# Patient Record
Sex: Female | Born: 1965 | Race: White | Hispanic: No | Smoking: Never smoker
Health system: Southern US, Community
[De-identification: ages and names within clinical notes are randomized; demographics above are authoritative.]

## PROBLEM LIST (undated history)

## (undated) DIAGNOSIS — R319 Hematuria, unspecified: Secondary | ICD-10-CM

## (undated) DIAGNOSIS — K219 Gastro-esophageal reflux disease without esophagitis: Secondary | ICD-10-CM

## (undated) DIAGNOSIS — M419 Scoliosis, unspecified: Secondary | ICD-10-CM

## (undated) DIAGNOSIS — T7840XA Allergy, unspecified, initial encounter: Secondary | ICD-10-CM

## (undated) DIAGNOSIS — M722 Plantar fascial fibromatosis: Secondary | ICD-10-CM

## (undated) HISTORY — DX: Hematuria, unspecified: R31.9

## (undated) HISTORY — PX: UPPER GI ENDOSCOPY: SHX6162

## (undated) HISTORY — DX: Scoliosis, unspecified: M41.9

## (undated) HISTORY — DX: Allergy, unspecified, initial encounter: T78.40XA

## (undated) HISTORY — PX: TONSILLECTOMY: SUR1361

## (undated) HISTORY — DX: Gastro-esophageal reflux disease without esophagitis: K21.9

## (undated) HISTORY — DX: Plantar fascial fibromatosis: M72.2

---

## 1998-10-28 ENCOUNTER — Other Ambulatory Visit: Admission: RE | Admit: 1998-10-28 | Discharge: 1998-10-28 | Payer: Self-pay | Admitting: Obstetrics and Gynecology

## 1999-11-06 ENCOUNTER — Other Ambulatory Visit: Admission: RE | Admit: 1999-11-06 | Discharge: 1999-11-06 | Payer: Self-pay | Admitting: Obstetrics and Gynecology

## 2000-11-18 ENCOUNTER — Other Ambulatory Visit: Admission: RE | Admit: 2000-11-18 | Discharge: 2000-11-18 | Payer: Self-pay | Admitting: Obstetrics and Gynecology

## 2001-12-25 ENCOUNTER — Other Ambulatory Visit: Admission: RE | Admit: 2001-12-25 | Discharge: 2001-12-25 | Payer: Self-pay | Admitting: Obstetrics and Gynecology

## 2003-01-28 ENCOUNTER — Other Ambulatory Visit: Admission: RE | Admit: 2003-01-28 | Discharge: 2003-01-28 | Payer: Self-pay | Admitting: Obstetrics and Gynecology

## 2003-06-02 ENCOUNTER — Ambulatory Visit (HOSPITAL_COMMUNITY): Admission: RE | Admit: 2003-06-02 | Discharge: 2003-06-02 | Payer: Self-pay | Admitting: Obstetrics and Gynecology

## 2004-03-11 ENCOUNTER — Ambulatory Visit: Payer: Self-pay | Admitting: Family Medicine

## 2004-03-13 ENCOUNTER — Other Ambulatory Visit: Admission: RE | Admit: 2004-03-13 | Discharge: 2004-03-13 | Payer: Self-pay | Admitting: Obstetrics and Gynecology

## 2004-12-04 ENCOUNTER — Ambulatory Visit: Payer: Self-pay | Admitting: Internal Medicine

## 2005-01-24 ENCOUNTER — Ambulatory Visit: Payer: Self-pay | Admitting: Family Medicine

## 2005-04-19 ENCOUNTER — Other Ambulatory Visit: Admission: RE | Admit: 2005-04-19 | Discharge: 2005-04-19 | Payer: Self-pay | Admitting: Obstetrics & Gynecology

## 2005-05-24 ENCOUNTER — Ambulatory Visit (HOSPITAL_COMMUNITY): Admission: RE | Admit: 2005-05-24 | Discharge: 2005-05-24 | Payer: Self-pay | Admitting: Obstetrics and Gynecology

## 2005-09-03 ENCOUNTER — Ambulatory Visit: Payer: Self-pay | Admitting: Internal Medicine

## 2005-12-25 ENCOUNTER — Ambulatory Visit: Payer: Self-pay | Admitting: Internal Medicine

## 2006-03-06 ENCOUNTER — Ambulatory Visit: Payer: Self-pay | Admitting: Internal Medicine

## 2006-05-06 ENCOUNTER — Ambulatory Visit: Payer: Self-pay | Admitting: Internal Medicine

## 2006-05-28 ENCOUNTER — Ambulatory Visit: Payer: Self-pay | Admitting: Internal Medicine

## 2006-06-18 ENCOUNTER — Other Ambulatory Visit: Admission: RE | Admit: 2006-06-18 | Discharge: 2006-06-18 | Payer: Self-pay | Admitting: Obstetrics & Gynecology

## 2006-07-17 ENCOUNTER — Ambulatory Visit (HOSPITAL_COMMUNITY): Admission: RE | Admit: 2006-07-17 | Discharge: 2006-07-17 | Payer: Self-pay | Admitting: Obstetrics and Gynecology

## 2006-10-14 DIAGNOSIS — K219 Gastro-esophageal reflux disease without esophagitis: Secondary | ICD-10-CM

## 2007-02-24 ENCOUNTER — Ambulatory Visit: Payer: Self-pay | Admitting: Internal Medicine

## 2007-02-24 DIAGNOSIS — H01009 Unspecified blepharitis unspecified eye, unspecified eyelid: Secondary | ICD-10-CM | POA: Insufficient documentation

## 2007-07-18 ENCOUNTER — Ambulatory Visit (HOSPITAL_COMMUNITY): Admission: RE | Admit: 2007-07-18 | Discharge: 2007-07-18 | Payer: Self-pay | Admitting: Obstetrics & Gynecology

## 2007-07-29 ENCOUNTER — Other Ambulatory Visit: Admission: RE | Admit: 2007-07-29 | Discharge: 2007-07-29 | Payer: Self-pay | Admitting: Obstetrics and Gynecology

## 2008-08-19 ENCOUNTER — Ambulatory Visit: Payer: Self-pay | Admitting: Sports Medicine

## 2008-08-19 DIAGNOSIS — M722 Plantar fascial fibromatosis: Secondary | ICD-10-CM

## 2008-08-19 DIAGNOSIS — M4120 Other idiopathic scoliosis, site unspecified: Secondary | ICD-10-CM | POA: Insufficient documentation

## 2008-08-19 DIAGNOSIS — M2170 Unequal limb length (acquired), unspecified site: Secondary | ICD-10-CM

## 2008-09-15 ENCOUNTER — Ambulatory Visit (HOSPITAL_COMMUNITY): Admission: RE | Admit: 2008-09-15 | Discharge: 2008-09-15 | Payer: Self-pay | Admitting: Obstetrics & Gynecology

## 2010-07-07 ENCOUNTER — Other Ambulatory Visit: Payer: Self-pay | Admitting: Nurse Practitioner

## 2010-07-07 ENCOUNTER — Other Ambulatory Visit: Payer: Self-pay | Admitting: Obstetrics and Gynecology

## 2010-07-07 DIAGNOSIS — Z1231 Encounter for screening mammogram for malignant neoplasm of breast: Secondary | ICD-10-CM

## 2010-07-14 ENCOUNTER — Ambulatory Visit (HOSPITAL_COMMUNITY): Payer: Self-pay

## 2010-07-24 ENCOUNTER — Ambulatory Visit (HOSPITAL_COMMUNITY): Payer: Self-pay

## 2010-07-26 ENCOUNTER — Ambulatory Visit (HOSPITAL_COMMUNITY)
Admission: RE | Admit: 2010-07-26 | Discharge: 2010-07-26 | Disposition: A | Discharge status: Home / Self Care | Payer: BC Managed Care – PPO | Source: Ambulatory Visit | Attending: Obstetrics and Gynecology | Admitting: Obstetrics and Gynecology

## 2010-07-26 DIAGNOSIS — Z1231 Encounter for screening mammogram for malignant neoplasm of breast: Secondary | ICD-10-CM | POA: Insufficient documentation

## 2010-12-05 ENCOUNTER — Encounter: Payer: Self-pay | Admitting: Family Medicine

## 2010-12-05 ENCOUNTER — Ambulatory Visit (INDEPENDENT_AMBULATORY_CARE_PROVIDER_SITE_OTHER): Payer: BC Managed Care – PPO | Admitting: Family Medicine

## 2010-12-05 VITALS — BP 120/88 | HR 95 | Temp 100.4°F | Wt 156.0 lb

## 2010-12-05 DIAGNOSIS — J40 Bronchitis, not specified as acute or chronic: Secondary | ICD-10-CM

## 2010-12-05 MED ORDER — AZITHROMYCIN 250 MG PO TABS: ORAL_TABLET | ORAL | Status: AC

## 2010-12-05 MED ORDER — METHYLPREDNISOLONE ACETATE 80 MG/ML IJ SUSP
120.0000 mg | Freq: Once | INTRAMUSCULAR | Status: AC
  Administered 2010-12-05: 120 mg via INTRAMUSCULAR

## 2010-12-05 MED ORDER — HYDROCODONE-HOMATROPINE 5-1.5 MG/5ML PO SYRP: 5.0000 mL | ORAL_SOLUTION | ORAL | Status: AC | PRN

## 2010-12-05 NOTE — Progress Notes (Signed)
  Subjective:    Patient ID: Elizabeth [name], female    DOB: 25-Feb-1965, 45 y.o.   MRN: [id number]  HPI Here for one week of stuffy head, PND, ST, chest tightness, and a dry cough. No fever.    Review of Systems  Constitutional: Negative.   HENT: Positive for congestion, postnasal drip and sinus pressure.   Eyes: Negative.   Respiratory: Positive for cough.        Objective:   Physical Exam  Constitutional: She appears well-developed and well-nourished.  HENT:  Right Ear: External ear normal.  Left Ear: External ear normal.  Nose: Nose normal.  Mouth/Throat: Oropharynx is clear and moist. No oropharyngeal exudate.  Eyes: Conjunctivae are normal. Pupils are equal, round, and reactive to light.  Neck: No thyromegaly present.  Pulmonary/Chest: Effort normal and breath sounds normal. No respiratory distress. She has no wheezes. She has no rales. She exhibits no tenderness.  Lymphadenopathy:    She has no cervical adenopathy.          Assessment & Plan:  Recheck prn

## 2010-12-05 NOTE — Progress Notes (Signed)
Addended by: Aniceto Boss A on: 12/05/2010 05:03 PM   Modules accepted: Orders

## 2011-01-29 ENCOUNTER — Encounter: Payer: Self-pay | Admitting: Internal Medicine

## 2011-01-29 ENCOUNTER — Ambulatory Visit (INDEPENDENT_AMBULATORY_CARE_PROVIDER_SITE_OTHER): Payer: BC Managed Care – PPO | Admitting: Internal Medicine

## 2011-01-29 VITALS — BP 114/80 | Temp 100.8°F | Wt 148.0 lb

## 2011-01-29 DIAGNOSIS — J069 Acute upper respiratory infection, unspecified: Secondary | ICD-10-CM

## 2011-01-29 MED ORDER — HYDROCODONE-HOMATROPINE 5-1.5 MG/5ML PO SYRP: 5.0000 mL | ORAL_SOLUTION | Freq: Four times a day (QID) | ORAL | Status: DC | PRN

## 2011-01-29 NOTE — Patient Instructions (Signed)
Get plenty of rest, Drink lots of  clear liquids, and use Tylenol or ibuprofen for fever and discomfort.    Afrin NS use twice daily

## 2011-01-29 NOTE — Progress Notes (Signed)
  Subjective:    Patient ID: Elizabeth [name], female    DOB: 18-Jan-1966, 45 y.o.   MRN: [id number]  HPI  45 year old patient who presents with a three-day history of cough and congestion. She does have a history of allergic rhinitis. She has had fever to 102. There is been minimal sputum production no wheezing chest pain or shortness of breath. She has been using Mucinex DM and saline irrigation. She is followed by an allergist.    Review of Systems  Constitutional: Positive for fever and fatigue. Negative for chills.  HENT: Positive for congestion and rhinorrhea. Negative for hearing loss, sore throat, dental problem, sinus pressure and tinnitus.   Eyes: Negative for pain, discharge and visual disturbance.  Respiratory: Positive for cough. Negative for shortness of breath.   Cardiovascular: Negative for chest pain, palpitations and leg swelling.  Gastrointestinal: Negative for nausea, vomiting, abdominal pain, diarrhea, constipation, blood in stool and abdominal distention.  Genitourinary: Negative for dysuria, urgency, frequency, hematuria, flank pain, vaginal bleeding, vaginal discharge, difficulty urinating, vaginal pain and pelvic pain.  Musculoskeletal: Negative for joint swelling, arthralgias and gait problem.  Skin: Negative for rash.  Neurological: Negative for dizziness, syncope, speech difficulty, weakness, numbness and headaches.  Hematological: Negative for adenopathy.  Psychiatric/Behavioral: Negative for behavioral problems, dysphoric mood and agitation. The patient is not nervous/anxious.        Objective:   Physical Exam  Constitutional: She is oriented to person, place, and time. She appears well-developed and well-nourished.  HENT:  Head: Normocephalic.  Right Ear: External ear normal.  Left Ear: External ear normal.  Mouth/Throat: Oropharynx is clear and moist.  Eyes: Conjunctivae and EOM are normal. Pupils are equal, round, and reactive to light.  Neck: Normal  range of motion. Neck supple. No thyromegaly present.  Cardiovascular: Normal rate, regular rhythm, normal heart sounds and intact distal pulses.   Pulmonary/Chest: Effort normal and breath sounds normal.  Abdominal: Soft. Bowel sounds are normal. She exhibits no mass. There is no tenderness.  Musculoskeletal: Normal range of motion.  Lymphadenopathy:    She has no cervical adenopathy.  Neurological: She is alert and oriented to person, place, and time.  Skin: Skin is warm and dry. No rash noted.  Psychiatric: She has a normal mood and affect. Her behavior is normal.          Assessment & Plan:   Viral URI. Will treat systematically. We'll force fluids use short-term Afrin nasal spray. Allergic rhinitis

## 2011-02-01 ENCOUNTER — Telehealth: Payer: Self-pay | Admitting: *Deleted

## 2011-02-01 MED ORDER — AZITHROMYCIN 250 MG PO TABS: ORAL_TABLET | ORAL | Status: AC

## 2011-02-01 NOTE — Telephone Encounter (Signed)
Pt was seen on Monday with fever and severe congestion.  Congestion is green and thicker, still has a fever and not feeling any better.  Needs advise on what to do.  [employer].

## 2011-02-01 NOTE — Telephone Encounter (Signed)
Pls advise.  

## 2011-02-01 NOTE — Telephone Encounter (Signed)
zpack

## 2011-02-01 NOTE — Telephone Encounter (Signed)
rx sent to pharmacy electronically.

## 2011-02-28 ENCOUNTER — Encounter: Payer: Self-pay | Admitting: Internal Medicine

## 2011-04-17 ENCOUNTER — Encounter: Payer: Self-pay | Admitting: *Deleted

## 2011-04-27 ENCOUNTER — Ambulatory Visit (INDEPENDENT_AMBULATORY_CARE_PROVIDER_SITE_OTHER): Payer: BC Managed Care – PPO | Admitting: Internal Medicine

## 2011-04-27 ENCOUNTER — Encounter: Payer: Self-pay | Admitting: Internal Medicine

## 2011-04-27 VITALS — BP 112/64 | HR 76 | Ht 64.0 in | Wt 150.0 lb

## 2011-04-27 DIAGNOSIS — K219 Gastro-esophageal reflux disease without esophagitis: Secondary | ICD-10-CM

## 2011-04-27 NOTE — Progress Notes (Signed)
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 She describes it as a gurgling sensation. She wakes up in the morning with a sore throat and cough. It started one day as she was working in her yard spreading mulch.She thought it was allergies.  Her allergist put her on Nexium 40 mg a day which she has taken now for 10 years but feels that it is not working anymore. She denies dysphagia, odynophagia or heartburn. She doesn't smoke and does not take any anti-inflammatory agents. She drinks 1-2 glasses of wine a day.   Past Medical History  Diagnosis Date  . GERD (gastroesophageal reflux disease)   . Allergy     sees Dr. Lucie Leather  . Scoliosis     mild thoracolumnar-per Dr Lesia Hausen note in [facility]  . Plantar fasciitis    Past Surgical History  Procedure Date  . Tonsillectomy     reports that she has never smoked. She has never used smokeless tobacco. She reports that she drinks about .5 - 1 ounces of alcohol per week. She reports that she does not use illicit drugs. family history includes Coronary artery disease in her other and Heart disease in her other.  There is no history of Colon cancer. Allergies  Allergen Reactions  . Pantoprazole Sodium     REACTION: unspecified        Review of Systems: Denies chest pain shortness of breath  The remainder of the 10 point ROS is negative except as outlined in H&P   Physical Exam: General appearance  Well developed, in no distress. Normal voice. Eyes- non icteric. HEENT nontraumatic, normocephalic. Mouth no lesions, tongue papillated, no cheilosis. Neck supple without adenopathy, thyroid not enlarged, no carotid bruits, no JVD. Lungs Clear to auscultation bilaterally. Cor normal S1, normal S2, regular rhythm, no murmur,  quiet precordium. Abdomen: Soft nontender abdomen with  normal active bowel sounds. No distention. Rectal: Not done. Extremities no pedal edema. Skin no lesions., no avm's,  Neurological alert and oriented x 3. Psychological normal mood and affect.  Assessment and Plan:  Problem #1 Long-standing sensation in the upper chest and upper esophagus suggestive of gastroesophageal reflux. It has been up to this point controlled by Nexium 40 mg a day but she is having some breakthrough symptoms. Other possible explanations would be globus sensation from spasm or possibly post nasal drip or allergies. She is concerned about taking her PPI for 10 years without having a diagnostic test and I agree with her that an upper endoscopy would be in order with biopsies of the distal esophagus to rule out Barrett's esophagus. I asked her at this point to stop taking her Nexium until we have a chance to endoscopically examine her esophagus.   04/27/2011 Lina Sar

## 2011-04-27 NOTE — Patient Instructions (Signed)
You have been scheduled for an endoscopy with propofol. Please follow written instructions given to you at your visit today. CC: Dr Eleonore Chiquito

## 2011-05-11 ENCOUNTER — Encounter: Payer: Self-pay | Admitting: Internal Medicine

## 2011-05-11 ENCOUNTER — Ambulatory Visit (AMBULATORY_SURGERY_CENTER): Payer: BC Managed Care – PPO | Admitting: Internal Medicine

## 2011-05-11 VITALS — BP 158/89 | HR 86 | Temp 96.7°F | Resp 20 | Ht 64.0 in | Wt 150.0 lb

## 2011-05-11 DIAGNOSIS — K219 Gastro-esophageal reflux disease without esophagitis: Secondary | ICD-10-CM

## 2011-05-11 MED ORDER — SODIUM CHLORIDE 0.9 % IV SOLN: 500.0000 mL | INTRAVENOUS | Status: DC

## 2011-05-11 MED ORDER — HYOSCYAMINE SULFATE 0.125 MG SL SUBL: 0.1250 mg | SUBLINGUAL_TABLET | SUBLINGUAL | Status: DC | PRN

## 2011-05-11 NOTE — Progress Notes (Signed)
Patient did not experience any of the following events: a burn prior to discharge; a fall within the facility; wrong site/side/patient/procedure/implant event; or a hospital transfer or hospital admission upon discharge from the facility. (G8907) Patient did not have preoperative order for IV antibiotic SSI prophylaxis. (G8918)  

## 2011-05-11 NOTE — Op Note (Signed)
[facility] [address] [place], [place]  [address]  ENDOSCOPY PROCEDURE REPORT  PATIENT:  Elizabeth [name], Elizabeth [name]  MR#:  [id number] BIRTHDATE:  1966/01/21, 46 yrs. old  GENDER:  female  ENDOSCOPIST:  Hedwig Morton. Juanda Chance, MD Referred by:  Eleonore Chiquito, M.D.  PROCEDURE DATE:  05/11/2011 PROCEDURE:  EGD, diagnostic 43235 ASA CLASS:  Class I INDICATIONS:  globus, GERD Nexiem x 10 years, stopped working  MEDICATIONS:   MAC sedation, administered by CRNA, propofol (Diprivan) 150 mg TOPICAL ANESTHETIC:  none  DESCRIPTION OF PROCEDURE:   After the risks benefits and alternatives of the procedure were thoroughly explained, informed consent was obtained.  The LB GIF-H180 [id number] endoscope was introduced through the mouth and advanced to the second portion of the duodenum, without limitations.  The instrument was slowly withdrawn as the mucosa was fully examined. <<PROCEDUREIMAGES>>  The upper, middle, and distal third of the esophagus were carefully inspected and no abnormalities were noted. The z-line was well seen at the GEJ. The endoscope was pushed into the fundus which was normal including a retroflexed view. The antrum,gastric body, first and second part of the duodenum were unremarkable (see image1, image2, image3, image4, image5, image6, image7, and image8).    Retroflexed views revealed no abnormalities.    The scope was then withdrawn from the patient and the procedure completed.  COMPLICATIONS:  None  ENDOSCOPIC IMPRESSION: 1) Normal EGD no stricture, hiatal hernia or esophagitis RECOMMENDATIONS: 1) Anti-reflux regimen to be follow stop nexiem, may use OTC preparations such as Ranitidine, Gaviscon consider Barium esophagram to assess motility if Sx's persist  REPEAT EXAM:  In 0 year(s) for.  ______________________________ Hedwig Morton. Juanda Chance, MD  CC:  n. eSIGNED:   Hedwig Morton. Lashanti Chambless at 05/11/2011 09:12 AM  Cecile Hearing, [id number]

## 2011-05-11 NOTE — Progress Notes (Signed)
Patient did not experience any of the following events: a burn prior to discharge; a fall within the facility; wrong site/side/patient/procedure/implant event; or a hospital transfer or hospital admission upon discharge from the facility. (G8907) Patient did not have preoperative order for IV antibiotic SSI prophylaxis. (G8918) Patient did not have preoperative order for IV antibiotic SSI prophylaxis. (G8918)  

## 2011-05-11 NOTE — Patient Instructions (Signed)

## 2011-05-14 ENCOUNTER — Telehealth: Payer: Self-pay | Admitting: *Deleted

## 2011-05-14 NOTE — Telephone Encounter (Signed)
  Follow up Call-  Call back number 05/11/2011  Post procedure Call Back phone  # [phone]  Permission to leave phone message Yes     Patient questions:  Left message to call if necessary.

## 2011-08-08 ENCOUNTER — Other Ambulatory Visit: Payer: Self-pay | Admitting: Certified Nurse Midwife

## 2011-08-08 DIAGNOSIS — Z1231 Encounter for screening mammogram for malignant neoplasm of breast: Secondary | ICD-10-CM

## 2011-08-13 ENCOUNTER — Encounter: Payer: Self-pay | Admitting: Internal Medicine

## 2011-08-13 ENCOUNTER — Ambulatory Visit (INDEPENDENT_AMBULATORY_CARE_PROVIDER_SITE_OTHER): Payer: BC Managed Care – PPO | Admitting: Internal Medicine

## 2011-08-13 VITALS — BP 120/70 | Temp 98.1°F | Wt 148.0 lb

## 2011-08-13 DIAGNOSIS — J309 Allergic rhinitis, unspecified: Secondary | ICD-10-CM | POA: Insufficient documentation

## 2011-08-13 DIAGNOSIS — J069 Acute upper respiratory infection, unspecified: Secondary | ICD-10-CM

## 2011-08-13 MED ORDER — HYDROCODONE-HOMATROPINE 5-1.5 MG/5ML PO SYRP: 5.0000 mL | ORAL_SOLUTION | Freq: Four times a day (QID) | ORAL | Status: AC | PRN

## 2011-08-13 NOTE — Patient Instructions (Addendum)
Get plenty of rest, Drink lots of  clear liquids, and use Tylenol or ibuprofen for fever and discomfort.    Norel AD  One every 6 hours as needed  Qnasl  Use daily  Cough med as needed

## 2011-08-13 NOTE — Progress Notes (Signed)
  Subjective:    Patient ID: [name], female    DOB: 1965-03-15, 46 y.o.   MRN: [id number]  HPI 46 year old patient who presents with a one-week history of nasal congestion chest congestion and nonproductive cough. She does have a history of allergic rhinitis. No fever wheezing or sputum production.  She has had a GI evaluation and chronic Nexium therapy has been discontinued. She has done well off medication without reflux symptoms.    Review of Systems  Constitutional: Negative.   HENT: Positive for congestion. Negative for hearing loss, sore throat, rhinorrhea, dental problem, sinus pressure and tinnitus.   Eyes: Negative for pain, discharge and visual disturbance.  Respiratory: Positive for cough. Negative for shortness of breath.   Cardiovascular: Negative for chest pain, palpitations and leg swelling.  Gastrointestinal: Negative for nausea, vomiting, abdominal pain, diarrhea, constipation, blood in stool and abdominal distention.  Genitourinary: Negative for dysuria, urgency, frequency, hematuria, flank pain, vaginal bleeding, vaginal discharge, difficulty urinating, vaginal pain and pelvic pain.  Musculoskeletal: Negative for joint swelling, arthralgias and gait problem.  Skin: Negative for rash.  Neurological: Negative for dizziness, syncope, speech difficulty, weakness, numbness and headaches.  Hematological: Negative for adenopathy.  Psychiatric/Behavioral: Negative for behavioral problems, dysphoric mood and agitation. The patient is not nervous/anxious.        Objective:   Physical Exam  Constitutional: She is oriented to person, place, and time. She appears well-developed and well-nourished.  HENT:  Head: Normocephalic.  Right Ear: External ear normal.  Left Ear: External ear normal.  Mouth/Throat: Oropharynx is clear and moist.  Eyes: Conjunctivae and EOM are normal. Pupils are equal, round, and reactive to light.  Neck: Normal range of motion. Neck supple. No  thyromegaly present.  Cardiovascular: Normal rate, regular rhythm, normal heart sounds and intact distal pulses.   Pulmonary/Chest: Effort normal and breath sounds normal.  Abdominal: Soft. Bowel sounds are normal. She exhibits no mass. There is no tenderness.  Musculoskeletal: Normal range of motion.  Lymphadenopathy:    She has no cervical adenopathy.  Neurological: She is alert and oriented to person, place, and time.  Skin: Skin is warm and dry. No rash noted.  Psychiatric: She has a normal mood and affect. Her behavior is normal.          Assessment & Plan:   Viral URI with cough.  Treat symptomatically

## 2011-08-16 ENCOUNTER — Telehealth: Payer: Self-pay | Admitting: Internal Medicine

## 2011-08-16 MED ORDER — VALACYCLOVIR HCL 500 MG PO TABS: 500.0000 mg | ORAL_TABLET | ORAL | Status: DC | PRN

## 2011-08-16 MED ORDER — AZITHROMYCIN 250 MG PO TABS: ORAL_TABLET | ORAL | Status: AC

## 2011-08-16 NOTE — Telephone Encounter (Signed)
Caller: Lu/Patient; PCP: Eleonore Chiquito; CB#: [phone]; ; ; Call regarding Sinus Infection; Onset- 08/07/11  Afebrile.  Pt has cough, congestion, sinus  pressure. States she was seen this week but antibiotic was not given and she is calling to request antibiotic. She also states she mentioned to nurse at visit she needed a refill of Valtrex. Emergent s/s of URI s/s protocol r/o. See provider within 24hrs. Pt already evaluated, requesting antibiotic. Pharmacy is [facility]. Pt also would like to discuss changing care to Dr. [name].

## 2011-08-16 NOTE — Telephone Encounter (Signed)
Z Pack Valtrex RF OK Transfer Dr [name] OK

## 2011-08-16 NOTE — Telephone Encounter (Signed)
I have handled med rx's -  FYI to you r/t transfer of care request

## 2011-08-16 NOTE — Telephone Encounter (Signed)
Please advise 

## 2011-08-20 ENCOUNTER — Other Ambulatory Visit: Payer: Self-pay | Admitting: Family Medicine

## 2011-08-27 ENCOUNTER — Telehealth: Payer: Self-pay | Admitting: Family Medicine

## 2011-08-27 NOTE — Telephone Encounter (Signed)
Caller: Elizabeth [name]/Patient; PCP: Eleonore Chiquito; CB#: [phone]; ; ; Call regarding Cough/Congestion;  Onset- weeks now per pt. Afebrile. States she finished her Zpak on the 16th and still has s/s. She has chest congestion and a  cough. Emergent s/s of Cough protocol r/o. Pt to see provider within 24 hrs. Appt scheduled for tomorrow 08/28/11 at 10:45am with Dr. [name].

## 2011-08-28 ENCOUNTER — Encounter: Payer: Self-pay | Admitting: Family Medicine

## 2011-08-28 ENCOUNTER — Ambulatory Visit (INDEPENDENT_AMBULATORY_CARE_PROVIDER_SITE_OTHER): Payer: BC Managed Care – PPO | Admitting: Family Medicine

## 2011-08-28 VITALS — BP 120/78 | HR 87 | Temp 98.9°F | Wt 149.0 lb

## 2011-08-28 DIAGNOSIS — J40 Bronchitis, not specified as acute or chronic: Secondary | ICD-10-CM

## 2011-08-28 MED ORDER — AMOXICILLIN-POT CLAVULANATE 875-125 MG PO TABS: 1.0000 | ORAL_TABLET | Freq: Two times a day (BID) | ORAL | Status: DC

## 2011-08-28 MED ORDER — METHYLPREDNISOLONE ACETATE 80 MG/ML IJ SUSP
120.0000 mg | Freq: Once | INTRAMUSCULAR | Status: AC
Start: 2011-08-28 — End: 2011-08-28
  Administered 2011-08-28: 120 mg via INTRAMUSCULAR

## 2011-08-28 MED ORDER — HYDROCODONE-HOMATROPINE 5-1.5 MG/5ML PO SYRP: 5.0000 mL | ORAL_SOLUTION | ORAL | Status: DC | PRN

## 2011-08-28 NOTE — Telephone Encounter (Signed)
ok 

## 2011-08-28 NOTE — Telephone Encounter (Signed)
Okay with me to switch 

## 2011-08-28 NOTE — Addendum Note (Signed)
Addended by: Aniceto Boss A on: 08/28/2011 12:08 PM   Modules accepted: Orders

## 2011-08-28 NOTE — Progress Notes (Signed)
  Subjective:    Patient ID: Elizabeth [name], female    DOB: [birth year], 46 y.o.   MRN: [id number]  HPI Here for continued URI symptoms. She was here a few weeks ago and was given a Zpack. This did not help much. She is still coughing with a lot of chest congestion. The cough is dry. No fever.    Review of Systems  Constitutional: Negative.   HENT: Positive for congestion and postnasal drip.   Eyes: Negative.   Respiratory: Positive for cough and chest tightness. Negative for wheezing.        Objective:   Physical Exam  Constitutional: She appears well-developed and well-nourished.  HENT:  Right Ear: External ear normal.  Left Ear: External ear normal.  Nose: Nose normal.  Eyes: Conjunctivae are normal.  Neck: No thyromegaly present.  Pulmonary/Chest: Effort normal and breath sounds normal. No respiratory distress. She has no wheezes. She has no rales.       Scattered rhonchi  Lymphadenopathy:    She has no cervical adenopathy.          Assessment & Plan:  Given a steroid shot along with Augmentin. Recheck prn

## 2011-08-28 NOTE — Telephone Encounter (Signed)
Called pt to advise her that Dr. [name] is currently not accepting any new patients.  Pt states she was just seen by Dr. [name] today and was under the impression that he was already her pcp.  I reiterated to the patient that she is still a pt of Dr. Vernon Prey and we must get approval to switch pcps.  Please advise.

## 2011-08-29 ENCOUNTER — Ambulatory Visit (HOSPITAL_COMMUNITY)
Admission: RE | Admit: 2011-08-29 | Discharge: 2011-08-29 | Disposition: A | Discharge status: Home / Self Care | Payer: BC Managed Care – PPO | Source: Ambulatory Visit | Attending: Certified Nurse Midwife | Admitting: Certified Nurse Midwife

## 2011-08-29 DIAGNOSIS — Z1231 Encounter for screening mammogram for malignant neoplasm of breast: Secondary | ICD-10-CM

## 2012-05-07 ENCOUNTER — Ambulatory Visit (INDEPENDENT_AMBULATORY_CARE_PROVIDER_SITE_OTHER): Payer: BC Managed Care – PPO | Admitting: Family Medicine

## 2012-05-07 ENCOUNTER — Encounter: Payer: Self-pay | Admitting: Family Medicine

## 2012-05-07 VITALS — BP 118/76 | HR 81 | Temp 100.6°F | Wt 150.0 lb

## 2012-05-07 DIAGNOSIS — J019 Acute sinusitis, unspecified: Secondary | ICD-10-CM

## 2012-05-07 MED ORDER — AMOXICILLIN-POT CLAVULANATE 875-125 MG PO TABS: 1.0000 | ORAL_TABLET | Freq: Two times a day (BID) | ORAL | Status: AC

## 2012-05-07 MED ORDER — HYDROCODONE-HOMATROPINE 5-1.5 MG/5ML PO SYRP: 5.0000 mL | ORAL_SOLUTION | ORAL | Status: AC | PRN

## 2012-05-07 NOTE — Progress Notes (Signed)
  Subjective:    Patient ID: Elizabeth [name], female    DOB: [birth year], 47 y.o.   MRN: [id number]  HPI Here for 3 days of HA, PND, fever and a dry cough. Using Mucinex and Claritin.    Review of Systems  Constitutional: Positive for fever.  HENT: Positive for congestion, postnasal drip and sinus pressure.   Eyes: Negative.   Respiratory: Positive for cough.        Objective:   Physical Exam  Constitutional: She appears well-developed and well-nourished.  HENT:  Right Ear: External ear normal.  Left Ear: External ear normal.  Nose: Nose normal.  Mouth/Throat: Oropharynx is clear and moist.  Eyes: Conjunctivae are normal.  Pulmonary/Chest: Effort normal and breath sounds normal.  Lymphadenopathy:    She has no cervical adenopathy.          Assessment & Plan:  Drink fluids.

## 2012-05-16 ENCOUNTER — Telehealth: Payer: Self-pay | Admitting: Family Medicine

## 2012-05-16 MED ORDER — LEVOFLOXACIN 500 MG PO TABS: 500.0000 mg | ORAL_TABLET | Freq: Every day | ORAL | Status: DC

## 2012-05-16 MED ORDER — METHYLPREDNISOLONE 4 MG PO KIT: PACK | ORAL | Status: DC

## 2012-05-16 NOTE — Telephone Encounter (Signed)
I sent both scripts e-scribe and left voice message for pt.

## 2012-05-16 NOTE — Telephone Encounter (Signed)
Patient Information:  Caller Name: Kaelan  Phone: [phone]  Patient: Elizabeth [name], Elizabeth [name]  Gender: Female  DOB: 1965/10/08  Age: 47 Years  PCP: Gershon Crane [facility])  Pregnant: No  Office Follow Up:  Does the office need to follow up with this patient?: Yes  Instructions For The Office: Please follow up with patient regarding appointment today as none appear available for Dr. [name].  She requests late afternoon if possible.  Thank you.   Symptoms  Reason For Call & Symptoms: "I'm not better."  Seen  05/07/12, on last day of antibiotics.  Cough  and sinus congestion persist. States she normally requires antibiotics and steroids to get well; she did not get steroids for current symptoms.  Sinus pain /pressure reported at 6 of 10.  Reviewed Health History In EMR: Yes  Reviewed Medications In EMR: Yes  Reviewed Allergies In EMR: Yes  Reviewed Surgeries / Procedures: Yes  Date of Onset of Symptoms: 05/04/2012  Treatments Tried: Augmentin, APAP, Guiafenesin  Treatments Tried Worked: No OB / GYN:  LMP: Unknown  Guideline(s) Used:  Sinus Pain and Congestion  Disposition Per Guideline:   See Today or Tomorrow in Office  Reason For Disposition Reached:   Sinus congestion (pressure, fullness) present > 10 days  Advice Given:  For a Stuffy Nose - Use Nasal Washes:  Introduction: Saline (salt water) nasal irrigation (nasal wash) is an effective and simple home remedy for treating stuffy nose and sinus congestion. The nose can be irrigated by pouring, spraying, or squirting salt water into the nose and then letting it run back out.  How it Helps: The salt water rinses out excess mucus, washes out any irritants (dust, allergens) that might be present, and moistens the nasal cavity.  Call Back If:   Sinus congestion (fullness) lasts longer than 10 days  You become worse.  Patient Will Follow Care Advice:  YES

## 2012-05-16 NOTE — Telephone Encounter (Signed)
No need for an OV today. Call in levaquin 500 mg q day for 10 days, also a medrol dose pack

## 2012-05-16 NOTE — Telephone Encounter (Signed)
Elizabeth [name], please ask Dr [name] to review for additional rx, OV, etc. Wants late afternoon if pssible - Dr [name] not here. See someone else this afternoon or add to the a.m.?

## 2012-07-14 ENCOUNTER — Other Ambulatory Visit: Payer: Self-pay | Admitting: Family Medicine

## 2012-08-04 ENCOUNTER — Other Ambulatory Visit: Payer: Self-pay | Admitting: Certified Nurse Midwife

## 2012-08-04 DIAGNOSIS — Z1231 Encounter for screening mammogram for malignant neoplasm of breast: Secondary | ICD-10-CM

## 2012-09-01 ENCOUNTER — Ambulatory Visit (HOSPITAL_COMMUNITY)
Admission: RE | Admit: 2012-09-01 | Discharge: 2012-09-01 | Disposition: A | Discharge status: Home / Self Care | Payer: BC Managed Care – PPO | Source: Ambulatory Visit | Attending: Certified Nurse Midwife | Admitting: Certified Nurse Midwife

## 2012-09-01 DIAGNOSIS — Z1231 Encounter for screening mammogram for malignant neoplasm of breast: Secondary | ICD-10-CM

## 2012-09-04 ENCOUNTER — Encounter: Payer: Self-pay | Admitting: Certified Nurse Midwife

## 2012-09-05 ENCOUNTER — Encounter: Payer: Self-pay | Admitting: Certified Nurse Midwife

## 2012-09-05 ENCOUNTER — Ambulatory Visit (INDEPENDENT_AMBULATORY_CARE_PROVIDER_SITE_OTHER): Payer: BC Managed Care – PPO | Admitting: Certified Nurse Midwife

## 2012-09-05 VITALS — BP 102/62 | HR 64 | Resp 16 | Ht 64.75 in | Wt 153.0 lb

## 2012-09-05 DIAGNOSIS — B009 Herpesviral infection, unspecified: Secondary | ICD-10-CM

## 2012-09-05 DIAGNOSIS — Z309 Encounter for contraceptive management, unspecified: Secondary | ICD-10-CM

## 2012-09-05 DIAGNOSIS — Z01419 Encounter for gynecological examination (general) (routine) without abnormal findings: Secondary | ICD-10-CM

## 2012-09-05 DIAGNOSIS — IMO0001 Reserved for inherently not codable concepts without codable children: Secondary | ICD-10-CM

## 2012-09-05 MED ORDER — VALACYCLOVIR HCL 500 MG PO TABS: 500.0000 mg | ORAL_TABLET | ORAL | Status: DC | PRN

## 2012-09-05 MED ORDER — NORETHIN ACE-ETH ESTRAD-FE 1-20 MG-MCG PO TABS: 1.0000 | ORAL_TABLET | Freq: Every day | ORAL | Status: DC

## 2012-09-05 NOTE — Progress Notes (Signed)
47 y.o. G0P0000 Single Caucasian Fe here for annual exam.  Periods scant to none. Contraception working well, no issues. See PCP prn. Not sexually active at present, no STD concerns or testing desired. No herpes outbreak in past year. No health issues today. Recent trip to [place]!  Patient's last menstrual period was 07/15/2011.          Sexually active: no  The current method of family planning is OCP (estrogen/progesterone).    Exercising: yes  walking & jogging Smoker:  no  Health Maintenance: Pap:  09-05-11 neg HPV HR neg MMG:  7/14 neg. Colonoscopy:  none BMD:   none TDaP:  2006 Labs: none Self breast exam: done occ   reports that she has never smoked. She has never used smokeless tobacco. She reports that she drinks about 3.5 ounces of alcohol per week. She reports that she does not use illicit drugs.  Past Medical History  Diagnosis Date  . GERD (gastroesophageal reflux disease)   . Allergy     sees Dr. Lucie Leather  . Scoliosis     mild thoracolumnar-per Dr Lesia Hausen note in [facility]  . Plantar fasciitis   . Hematuria     asymptomatic    Past Surgical History  Procedure Laterality Date  . Tonsillectomy      Current Outpatient Prescriptions  Medication Sig Dispense Refill  . GILDESS FE 1/20 1-20 MG-MCG tablet As directed      . mometasone (NASONEX) 50 MCG/ACT nasal spray       . valACYclovir (VALTREX) 500 MG tablet Take 1 tablet (500 mg total) by mouth as needed.  30 tablet  0  . [DISCONTINUED] zolpidem (AMBIEN) 10 MG tablet Take 10 mg by mouth at bedtime as needed.         No current facility-administered medications for this visit.    Family History  Problem Relation Age of Onset  . Coronary artery disease Other   . Heart disease Other   . Colon cancer Neg Hx   . Heart disease Father   . Hypertension Brother     ROS:  Pertinent items are noted in HPI.  Otherwise, a comprehensive ROS was negative.  Exam:   BP 102/62  Pulse 64  Resp 16  Ht  5' 4.75" (1.645 m)  Wt 153 lb (69.4 kg)  BMI 25.65 kg/m2  LMP 07/15/2011 Height: 5' 4.75" (164.5 cm)  Ht Readings from Last 3 Encounters:  09/05/12 5' 4.75" (1.645 m)  05/11/11 5\' 4"  (1.626 m)  04/27/11 5\' 4"  (1.626 m)    General appearance: alert, cooperative and appears stated age Head: Normocephalic, without obvious abnormality, atraumatic Neck: no adenopathy, supple, symmetrical, trachea midline and thyroid normal to inspection and palpation Lungs: clear to auscultation bilaterally Breasts: normal appearance, no masses or tenderness, No nipple retraction or dimpling, No nipple discharge or bleeding, No axillary or supraclavicular adenopathy Heart: regular rate and rhythm Abdomen: soft, non-tender; no masses,  no organomegaly Extremities: extremities normal, atraumatic, no cyanosis or edema Skin: Skin color, texture, turgor normal. No rashes or lesions Lymph nodes: Cervical, supraclavicular, and axillary nodes normal. No abnormal inguinal nodes palpated Neurologic: Grossly normal   Pelvic: External genitalia:  no lesions              Urethra:  normal appearing urethra with no masses, tenderness or lesions              Bartholin's and Skene's: normal  Vagina: normal appearing vagina with normal color and discharge, no lesions              Cervix: normal, non tender              Pap taken: no Bimanual Exam:  Uterus:  normal size, contour, position, consistency, mobility, non-tender and anteverted              Adnexa: normal adnexa and no mass, fullness, tenderness               Rectovaginal: Confirms               Anus:  normal sphincter tone, no lesions  A:  Well Woman with normal exam  Contraception OCP  History of herpes no outbreak in past year, needs Rx refill  P:   Reviewed health and wellness pertinent to exam  Rx Gildess 1/20 Fe  Rx Valtrex see order  Pap smear as per guidelines   Mammogram yearly pap smear not taken today  counseled on breast  self exam, mammography screening, adequate intake of calcium and vitamin D, diet and exercise, discussed colonoscopy recommended at 48 for screening, patient plans to schedule next summer.  return annually or prn  An After Visit Summary was printed and given to the patient.

## 2012-09-05 NOTE — Patient Instructions (Signed)

## 2012-09-08 NOTE — Progress Notes (Signed)
Note reviewed, agree with plan.  Reggie Welge, MD  

## 2012-10-13 ENCOUNTER — Telehealth: Payer: Self-pay | Admitting: Family Medicine

## 2012-10-13 NOTE — Telephone Encounter (Signed)
Patient is on Nexium DR 40mg  BID. This was rx'd by a Dr. Lucie Leather. It needs prior auth and that office will not do it, because patient needs an OV. Pharmacy is requesting we do it, but I cannot if we did not rx. Please advise regarding Dr. [name] prescribing.

## 2012-10-13 NOTE — Telephone Encounter (Signed)
She saw Dr. Juanda Chance (GI) for this last year and even had upper endoscopy. She needs to ask Dr. Juanda Chance for this prior authorization

## 2012-10-14 NOTE — Telephone Encounter (Signed)
Elizabeth [name], the request came from the pharmacy, not patient. I called pharmacy and they will send it Dr. Juanda Chance. Thank you.

## 2012-10-14 NOTE — Telephone Encounter (Signed)
Can you call pt?

## 2012-12-10 ENCOUNTER — Other Ambulatory Visit: Payer: Self-pay | Admitting: Family Medicine

## 2013-02-02 ENCOUNTER — Other Ambulatory Visit: Payer: Self-pay | Admitting: Family Medicine

## 2013-08-28 ENCOUNTER — Other Ambulatory Visit: Payer: Self-pay | Admitting: Certified Nurse Midwife

## 2013-08-28 DIAGNOSIS — Z1231 Encounter for screening mammogram for malignant neoplasm of breast: Secondary | ICD-10-CM

## 2013-09-08 ENCOUNTER — Ambulatory Visit (HOSPITAL_COMMUNITY)
Admission: RE | Admit: 2013-09-08 | Discharge: 2013-09-08 | Disposition: A | Discharge status: Home / Self Care | Payer: BC Managed Care – PPO | Source: Ambulatory Visit | Attending: Certified Nurse Midwife | Admitting: Certified Nurse Midwife

## 2013-09-08 DIAGNOSIS — Z1231 Encounter for screening mammogram for malignant neoplasm of breast: Secondary | ICD-10-CM | POA: Insufficient documentation

## 2013-09-09 ENCOUNTER — Ambulatory Visit (INDEPENDENT_AMBULATORY_CARE_PROVIDER_SITE_OTHER): Payer: BC Managed Care – PPO | Admitting: Certified Nurse Midwife

## 2013-09-09 ENCOUNTER — Encounter: Payer: Self-pay | Admitting: Certified Nurse Midwife

## 2013-09-09 VITALS — BP 118/82 | HR 72 | Ht 65.0 in | Wt 148.0 lb

## 2013-09-09 DIAGNOSIS — Z1211 Encounter for screening for malignant neoplasm of colon: Secondary | ICD-10-CM

## 2013-09-09 DIAGNOSIS — Z124 Encounter for screening for malignant neoplasm of cervix: Secondary | ICD-10-CM

## 2013-09-09 DIAGNOSIS — R319 Hematuria, unspecified: Secondary | ICD-10-CM

## 2013-09-09 DIAGNOSIS — Z01419 Encounter for gynecological examination (general) (routine) without abnormal findings: Secondary | ICD-10-CM

## 2013-09-09 DIAGNOSIS — Z0000 Encounter for general adult medical examination without abnormal findings: Secondary | ICD-10-CM

## 2013-09-09 LAB — POCT URINALYSIS DIPSTICK
BILIRUBIN UA: NEGATIVE
Glucose, UA: NEGATIVE
Ketones, UA: NEGATIVE
LEUKOCYTES UA: NEGATIVE
NITRITE UA: NEGATIVE
PH UA: 5
PROTEIN UA: NEGATIVE
UROBILINOGEN UA: NEGATIVE

## 2013-09-09 LAB — HEMOGLOBIN, FINGERSTICK: HEMOGLOBIN, FINGERSTICK: 12.1 g/dL (ref 12.0–16.0)

## 2013-09-09 NOTE — Patient Instructions (Signed)

## 2013-09-09 NOTE — Progress Notes (Signed)
Patient ID: Elizabeth [name], female   DOB: 28-Nov-1965, 48 y.o.   MRN: [id number] 48 y.o. G0P0000 Divorced Caucasian Fe here for annual exam. Periods scant to none with OCP use. Desires continuance for cycle control, history of menorrhagia and dysmenorrhea. Not sexually active. Sees PCP prn. Denies vaginal or urinary symptoms today. No new personal products. No health issues today.  Sexually active: no  The current method of family planning is OCP (estrogen/progesterone).  Exercising: yes walking, biking & jogging  Smoker: no   Health Maintenance:  Pap: 09-05-11 neg HPV HR neg  MMG: 09/08/13, Bi-Rads 1: negative Class B density  Colonoscopy: none  BMD: none  TDaP: 2006  Labs: HB:  12.1 Urine:  Mod RBC, others negative Self breast exam: done occ    reports that she has never smoked. She has never used smokeless tobacco. She reports that she drinks about 3.5 ounces of alcohol per week. She reports that she does not use illicit drugs.  Past Medical History  Diagnosis Date  . GERD (gastroesophageal reflux disease)   . Allergy     sees Dr. Lucie Leather  . Scoliosis     mild thoracolumnar-per Dr Lesia Hausen note in [facility]  . Plantar fasciitis   . Hematuria     asymptomatic    Past Surgical History  Procedure Laterality Date  . Tonsillectomy      Current Outpatient Prescriptions  Medication Sig Dispense Refill  . NASONEX 50 MCG/ACT nasal spray USE 2 SPRAYS IN EACH NOSTRIL ONCE DAILY  17 g  3  . norethindrone-ethinyl estradiol (GILDESS FE 1/20) 1-20 MG-MCG tablet Take 1 tablet by mouth daily. As directed  3 Package  4  . valACYclovir (VALTREX) 500 MG tablet Take 1 tablet (500 mg total) by mouth as needed.  30 tablet  12  . [DISCONTINUED] zolpidem (AMBIEN) 10 MG tablet Take 10 mg by mouth at bedtime as needed.         No current facility-administered medications for this visit.    Family History  Problem Relation Age of Onset  . Colon cancer Neg Hx   . Heart disease Father   .  Hypertension Brother   . Other Mother     AFIB    ROS:  Pertinent items are noted in HPI.  Otherwise, a comprehensive ROS was negative.  Exam:   BP 118/82  Pulse 72  Ht 5\' 5"  (1.651 m)  Wt 148 lb (67.132 kg)  BMI 24.63 kg/m2 Height: 5\' 5"  (165.1 cm)  Ht Readings from Last 3 Encounters:  09/09/13 5\' 5"  (1.651 m)  09/05/12 5' 4.75" (1.645 m)  05/11/11 5\' 4"  (1.626 m)    General appearance: alert, cooperative and appears stated age Head: Normocephalic, without obvious abnormality, atraumatic Neck: no adenopathy, supple, symmetrical, trachea midline and thyroid normal to inspection and palpation and non-palpable Lungs: clear to auscultation bilaterally Breasts: normal appearance, no masses or tenderness, No nipple retraction or dimpling, No nipple discharge or bleeding, No axillary or supraclavicular adenopathy Heart: regular rate and rhythm Abdomen: soft, non-tender; no masses,  no organomegaly Extremities: extremities normal, atraumatic, no cyanosis or edema Skin: Skin color, texture, turgor normal. No rashes or lesions Lymph nodes: Cervical, supraclavicular, and axillary nodes normal. No abnormal inguinal nodes palpated Neurologic: Grossly normal   Pelvic: External genitalia:  no lesions              Urethra:  normal appearing urethra with no masses, tenderness or lesions  Bartholin's and Skene's: normal                 Vagina: normal appearing vagina with normal color and discharge, no lesions              Cervix: normal, non tender              Pap taken: Yes.   Bimanual Exam:  Uterus:  normal size, contour, position, consistency, mobility, non-tender and anteverted              Adnexa: normal adnexa and no mass, fullness, tenderness               Rectovaginal: Confirms               Anus:  normal sphincter tone, no lesions  A:  Well Woman with normal exam  Contraception desires OCP for cycle control  Hematuria R/O UTI    P:   Reviewed health and wellness  pertinent to exam  Rx Loestrin 1/20 Fe see order  Urine micro/culture  Pap smear taken today with HPV reflex   counseled on breast self exam, mammography screening, use and side effects of OCP's, adequate intake of calcium and vitamin D, diet and exercise risks and benefits of colonoscopy, declines referral today. IFOB dispensed.  return annually or prn  An After Visit Summary was printed and given to the patient.

## 2013-09-10 LAB — URINALYSIS, MICROSCOPIC ONLY
BACTERIA UA: NONE SEEN
CASTS: NONE SEEN
Crystals: NONE SEEN
Squamous Epithelial / LPF: NONE SEEN

## 2013-09-10 LAB — URINE CULTURE
Colony Count: NO GROWTH
Organism ID, Bacteria: NO GROWTH

## 2013-09-10 NOTE — Progress Notes (Signed)
Reviewed personally.  M. Suzanne Starletta Houchin, MD.  

## 2013-09-11 LAB — IPS PAP TEST WITH REFLEX TO HPV

## 2013-11-10 ENCOUNTER — Encounter: Payer: Self-pay | Admitting: Internal Medicine

## 2013-11-21 ENCOUNTER — Other Ambulatory Visit: Payer: Self-pay | Admitting: Certified Nurse Midwife

## 2013-11-23 NOTE — Telephone Encounter (Signed)
Last refilled: 09/05/12 #3 packs/4 rfs Last AEX: 09/09/13 with Ms. Debbie AEX Scheduled: 11/25/14 with Dr. Hyacinth MeekerMiller  According to AEX note from 09/09/13 Loestrin was sent and to see order.  No rx was sent electronically to pharmacy, patient takes rx continuously.  Gildess FE 1/20 #84/4 rfs [facility]  Routed to provider for review, encounter closed.

## 2013-12-25 ENCOUNTER — Ambulatory Visit (AMBULATORY_SURGERY_CENTER): Payer: Self-pay | Admitting: *Deleted

## 2013-12-25 VITALS — Ht 65.0 in | Wt 156.0 lb

## 2013-12-25 DIAGNOSIS — Z1211 Encounter for screening for malignant neoplasm of colon: Secondary | ICD-10-CM

## 2013-12-25 NOTE — Progress Notes (Signed)
No egg or soy allergy. No anesthesia problems.  No home O2.  No diet meds.  

## 2014-01-04 ENCOUNTER — Encounter: Payer: Self-pay | Admitting: Internal Medicine

## 2014-01-13 ENCOUNTER — Telehealth: Payer: Self-pay | Admitting: Internal Medicine

## 2014-01-13 NOTE — Telephone Encounter (Signed)
Patient calling to report she has had a cold since Saturday. She is scheduled Friday for direct colonoscopy. She states her temperature was 99.1 last night. Her drainage is clear but she reports she does get sinus infections easily. She wants to be sure she should proceed with procedure on Friday.

## 2014-01-13 NOTE — Telephone Encounter (Signed)
She can have colonoscopy if Temp < 100.0

## 2014-01-13 NOTE — Telephone Encounter (Signed)
Patient notified of temperature cutoff.

## 2014-01-15 ENCOUNTER — Ambulatory Visit (AMBULATORY_SURGERY_CENTER): Payer: BC Managed Care – PPO | Admitting: Internal Medicine

## 2014-01-15 ENCOUNTER — Encounter: Payer: Self-pay | Admitting: Internal Medicine

## 2014-01-15 VITALS — BP 126/86 | HR 64 | Temp 97.2°F | Resp 13 | Ht 65.0 in | Wt 156.0 lb

## 2014-01-15 DIAGNOSIS — Z1211 Encounter for screening for malignant neoplasm of colon: Secondary | ICD-10-CM

## 2014-01-15 MED ORDER — SODIUM CHLORIDE 0.9 % IV SOLN: 500.0000 mL | INTRAVENOUS | Status: DC

## 2014-01-15 NOTE — Op Note (Signed)
[facility] [address] [place], [id number]   COLONOSCOPY PROCEDURE REPORT  PATIENT: Elizabeth LinseyLysiak, [name]  MR#: [id number] BIRTHDATE: 07/03/1965 , 48  yrs. old GENDER: female ENDOSCOPIST: Hart Carwinora M Kimm Ungaro, MD REFERRED XB:[id number]:Stephen Marguerita BeardsA Fry, M.D. PROCEDURE DATE:  01/15/2014 PROCEDURE:   Colonoscopy, screening First Screening Colonoscopy - Avg.  risk and is 50 yrs.  old or older Yes.  Prior Negative Screening - Now for repeat screening. N/A  History of Adenoma - Now for follow-up colonoscopy & has been > or = to 3 yrs.  N/A  Polyps Removed Today? No. ASA CLASS:   Class I INDICATIONS:average risk for colon cancer. MEDICATIONS: Monitored anesthesia care and Propofol 400 mg IV  DESCRIPTION OF PROCEDURE:   After the risks benefits and alternatives of the procedure were thoroughly explained, informed consent was obtained.  The digital rectal exam revealed no abnormalities of the rectum.   The LB PFC-H190 [id number]  endoscope was introduced through the anus and advanced to the cecum, which was identified by both the appendix and ileocecal valve. No adverse events experienced.   The quality of the prep was good, using MoviPrep  The instrument was then slowly withdrawn as the colon was fully examined.      COLON FINDINGS: There was mild diverticulosis noted in the sigmoid colon.  Retroflexed views revealed no abnormalities. The time to cecum=3 minutes 35 seconds.  Withdrawal time=7 minutes 19 seconds. The scope was withdrawn and the procedure completed. COMPLICATIONS: There were no immediate complications.  ENDOSCOPIC IMPRESSION: There was mild diverticulosis noted in the sigmoid colon  RECOMMENDATIONS: High fiber diet Recall colonoscopy in 10 years  eSigned:  Hart Carwinora M Zi Newbury, MD 01/15/2014 10:49 AM   cc:

## 2014-01-15 NOTE — Patient Instructions (Signed)

## 2014-01-15 NOTE — Progress Notes (Signed)
Pt stable to RR 

## 2014-01-18 ENCOUNTER — Ambulatory Visit (INDEPENDENT_AMBULATORY_CARE_PROVIDER_SITE_OTHER): Payer: BC Managed Care – PPO | Admitting: Family Medicine

## 2014-01-18 ENCOUNTER — Encounter: Payer: Self-pay | Admitting: Family Medicine

## 2014-01-18 ENCOUNTER — Telehealth: Payer: Self-pay

## 2014-01-18 VITALS — BP 131/87 | HR 84 | Temp 99.0°F | Ht 65.0 in | Wt 157.0 lb

## 2014-01-18 DIAGNOSIS — J0100 Acute maxillary sinusitis, unspecified: Secondary | ICD-10-CM

## 2014-01-18 MED ORDER — HYDROCODONE-HOMATROPINE 5-1.5 MG/5ML PO SYRP: 5.0000 mL | ORAL_SOLUTION | ORAL | Status: DC | PRN

## 2014-01-18 MED ORDER — AMOXICILLIN-POT CLAVULANATE 875-125 MG PO TABS: 1.0000 | ORAL_TABLET | Freq: Two times a day (BID) | ORAL | Status: DC

## 2014-01-18 NOTE — Progress Notes (Signed)
   Subjective:    Patient ID: Oval LinseySidonie K Tavella, female    DOB: 04/21/1965, 48 y.o.   MRN: [id number]  HPI Here for 10 days of sinus pressure, PND, and coughing up yellow sputum. On Mucinex.   Review of Systems  Constitutional: Negative.   HENT: Positive for congestion, postnasal drip and sinus pressure.   Eyes: Negative.   Respiratory: Positive for cough.        Objective:   Physical Exam  Constitutional: She appears well-developed and well-nourished.  HENT:  Right Ear: External ear normal.  Left Ear: External ear normal.  Nose: Nose normal.  Mouth/Throat: Oropharynx is clear and moist.  Eyes: Conjunctivae are normal.  Pulmonary/Chest: Effort normal and breath sounds normal.  Lymphadenopathy:    She has no cervical adenopathy.          Assessment & Plan:  Recheck prn

## 2014-01-18 NOTE — Telephone Encounter (Signed)
  Follow up Call-  Call back number 01/15/2014 05/11/2011  Post procedure Call Back phone  # [phone]  Permission to leave phone message Yes Yes     Patient questions:  Do you have a fever, pain , or abdominal swelling? No. Pain Score  0 *  Have you tolerated food without any problems? Yes.    Have you been able to return to your normal activities? Yes.    Do you have any questions about your discharge instructions: Diet   No. Medications  No. Follow up visit  No.  Do you have questions or concerns about your Care? No.  Actions: * If pain score is 4 or above: No action needed, pain <4.

## 2014-01-18 NOTE — Progress Notes (Signed)
Pre visit review using our clinic review tool, if applicable. No additional management support is needed unless otherwise documented below in the visit note. 

## 2014-01-25 ENCOUNTER — Telehealth: Payer: Self-pay | Admitting: Family Medicine

## 2014-01-25 NOTE — Telephone Encounter (Signed)
Stop the Augmentin and switch to Levaquin 500 mg daily for 10 days. Please call this in

## 2014-01-25 NOTE — Telephone Encounter (Signed)
Pt was in 12/14 for poss sinus inf. Pt states she is not any better and would like to know if dr fry could send in something else. Pt still congested.  [facility]

## 2014-01-26 MED ORDER — LEVOFLOXACIN 500 MG PO TABS: 500.0000 mg | ORAL_TABLET | Freq: Every day | ORAL | Status: DC

## 2014-01-26 NOTE — Telephone Encounter (Signed)
Pt is aware nurse will call her back

## 2014-01-26 NOTE — Telephone Encounter (Signed)
I sent script e-scribe and spoke with pt. 

## 2014-02-11 ENCOUNTER — Encounter: Payer: Self-pay | Admitting: Family Medicine

## 2014-02-11 ENCOUNTER — Ambulatory Visit (INDEPENDENT_AMBULATORY_CARE_PROVIDER_SITE_OTHER): Payer: BC Managed Care – PPO | Admitting: Family Medicine

## 2014-02-11 ENCOUNTER — Ambulatory Visit (INDEPENDENT_AMBULATORY_CARE_PROVIDER_SITE_OTHER)
Admission: RE | Admit: 2014-02-11 | Discharge: 2014-02-11 | Disposition: A | Discharge status: Home / Self Care | Payer: BC Managed Care – PPO | Source: Ambulatory Visit | Attending: Family Medicine | Admitting: Family Medicine

## 2014-02-11 VITALS — BP 112/78 | HR 91 | Temp 99.2°F | Ht 65.0 in | Wt 156.0 lb

## 2014-02-11 DIAGNOSIS — J209 Acute bronchitis, unspecified: Secondary | ICD-10-CM

## 2014-02-11 MED ORDER — CLARITHROMYCIN 500 MG PO TABS: 500.0000 mg | ORAL_TABLET | Freq: Two times a day (BID) | ORAL | Status: DC

## 2014-02-11 MED ORDER — ESOMEPRAZOLE MAGNESIUM 40 MG PO CPDR: 40.0000 mg | DELAYED_RELEASE_CAPSULE | Freq: Two times a day (BID) | ORAL | Status: DC

## 2014-02-11 NOTE — Progress Notes (Signed)
Pre visit review using our clinic review tool, if applicable. No additional management support is needed unless otherwise documented below in the visit note. 

## 2014-02-11 NOTE — Progress Notes (Signed)
   Subjective:    Patient ID: Elizabeth [name], female    DOB: 12/21/1965, 49 y.o.   MRN: [id number]  HPI Here for continued fevers and coughing for the past 3 weeks despite taking Augmentin and then Levaquin. No chest pains or SOB, but she feels very tired and wants to sleep a lot. Drinking fluids.    Review of Systems  Constitutional: Positive for fever and fatigue. Negative for chills and diaphoresis.  HENT: Negative.   Eyes: Negative.   Respiratory: Positive for cough.   Cardiovascular: Negative.        Objective:   Physical Exam  Constitutional: She appears well-developed and well-nourished. No distress.  HENT:  Right Ear: External ear normal.  Left Ear: External ear normal.  Nose: Nose normal.  Mouth/Throat: Oropharynx is clear and moist.  Eyes: Conjunctivae are normal.  Pulmonary/Chest: Effort normal and breath sounds normal. No respiratory distress. She has no wheezes. She has no rales.  Lymphadenopathy:    She has no cervical adenopathy.          Assessment & Plan:  This may be an atypical bronchitis. Start on Biaxin and we will get a CXR today

## 2014-02-15 ENCOUNTER — Telehealth: Payer: Self-pay | Admitting: Family Medicine

## 2014-02-15 NOTE — Telephone Encounter (Signed)
Pt would results of chest xray done last week. pls cb

## 2014-02-16 NOTE — Telephone Encounter (Signed)
I spoke with pt  

## 2014-09-16 ENCOUNTER — Ambulatory Visit: Payer: BC Managed Care – PPO | Admitting: Certified Nurse Midwife

## 2014-09-20 ENCOUNTER — Other Ambulatory Visit: Payer: Self-pay | Admitting: Obstetrics & Gynecology

## 2014-09-20 DIAGNOSIS — Z1231 Encounter for screening mammogram for malignant neoplasm of breast: Secondary | ICD-10-CM

## 2014-09-30 ENCOUNTER — Ambulatory Visit (HOSPITAL_COMMUNITY)
Admission: RE | Admit: 2014-09-30 | Discharge: 2014-09-30 | Disposition: A | Discharge status: Home / Self Care | Payer: BC Managed Care – PPO | Source: Ambulatory Visit | Attending: Obstetrics & Gynecology | Admitting: Obstetrics & Gynecology

## 2014-09-30 DIAGNOSIS — Z1231 Encounter for screening mammogram for malignant neoplasm of breast: Secondary | ICD-10-CM | POA: Diagnosis not present

## 2014-11-12 ENCOUNTER — Encounter: Payer: Self-pay | Admitting: Family Medicine

## 2014-11-12 ENCOUNTER — Ambulatory Visit (INDEPENDENT_AMBULATORY_CARE_PROVIDER_SITE_OTHER): Payer: BC Managed Care – PPO | Admitting: Family Medicine

## 2014-11-12 VITALS — BP 138/86 | HR 90 | Temp 98.7°F | Ht 65.0 in | Wt 163.0 lb

## 2014-11-12 DIAGNOSIS — J019 Acute sinusitis, unspecified: Secondary | ICD-10-CM

## 2014-11-12 MED ORDER — CLARITHROMYCIN 500 MG PO TABS: 500.0000 mg | ORAL_TABLET | Freq: Two times a day (BID) | ORAL | Status: DC

## 2014-11-12 NOTE — Progress Notes (Signed)
   Subjective:    Patient ID: Elizabeth [name], female    DOB: 06-Oct-1965, 49 y.o.   MRN: [id number]  HPI Here for one week of sinus pressure, PND, ear pressure, and PND. No cough.    Review of Systems  Constitutional: Negative.   HENT: Positive for congestion, postnasal drip and sinus pressure.   Eyes: Negative.   Respiratory: Negative.   Cardiovascular: Negative.        Objective:   Physical Exam  Constitutional: She appears well-developed and well-nourished.  HENT:  Right Ear: External ear normal.  Left Ear: External ear normal.  Nose: Nose normal.  Mouth/Throat: Oropharynx is clear and moist.  Eyes: Conjunctivae are normal.  Neck: No thyromegaly present.  Cardiovascular: Normal rate, regular rhythm, normal heart sounds and intact distal pulses.   Pulmonary/Chest: Effort normal and breath sounds normal.  Lymphadenopathy:    She has no cervical adenopathy.          Assessment & Plan:  Sinusitis, treat with Biaxin and Mucinex

## 2014-11-12 NOTE — Progress Notes (Signed)
Pre visit review using our clinic review tool, if applicable. No additional management support is needed unless otherwise documented below in the visit note. 

## 2014-11-25 ENCOUNTER — Ambulatory Visit (INDEPENDENT_AMBULATORY_CARE_PROVIDER_SITE_OTHER): Payer: BC Managed Care – PPO | Admitting: Obstetrics & Gynecology

## 2014-11-25 ENCOUNTER — Telehealth: Payer: Self-pay | Admitting: *Deleted

## 2014-11-25 ENCOUNTER — Encounter: Payer: Self-pay | Admitting: Obstetrics & Gynecology

## 2014-11-25 VITALS — BP 134/80 | HR 60 | Ht 64.25 in | Wt 155.0 lb

## 2014-11-25 DIAGNOSIS — Z0000 Encounter for general adult medical examination without abnormal findings: Secondary | ICD-10-CM

## 2014-11-25 DIAGNOSIS — Z23 Encounter for immunization: Secondary | ICD-10-CM | POA: Diagnosis not present

## 2014-11-25 DIAGNOSIS — Z01419 Encounter for gynecological examination (general) (routine) without abnormal findings: Secondary | ICD-10-CM

## 2014-11-25 LAB — COMPREHENSIVE METABOLIC PANEL
ALBUMIN: 4.9 g/dL (ref 3.6–5.1)
ALK PHOS: 29 U/L — AB (ref 33–130)
ALT: 27 U/L (ref 6–29)
AST: 24 U/L (ref 10–35)
BILIRUBIN TOTAL: 0.8 mg/dL (ref 0.2–1.2)
BUN: 9 mg/dL (ref 7–25)
CALCIUM: 9.9 mg/dL (ref 8.6–10.4)
CO2: 25 mmol/L (ref 20–31)
Chloride: 101 mmol/L (ref 98–110)
Creat: 0.62 mg/dL (ref 0.50–1.05)
GLUCOSE: 82 mg/dL (ref 65–99)
POTASSIUM: 4.9 mmol/L (ref 3.5–5.3)
Sodium: 139 mmol/L (ref 135–146)
Total Protein: 7.7 g/dL (ref 6.1–8.1)

## 2014-11-25 LAB — LIPID PANEL
CHOL/HDL RATIO: 2.7 ratio (ref <=5.0)
Cholesterol: 199 mg/dL (ref 125–200)
HDL: 75 mg/dL (ref >=46)
LDL CALC: 106 mg/dL (ref <130)
TRIGLYCERIDES: 90 mg/dL (ref <150)
VLDL: 18 mg/dL (ref <30)

## 2014-11-25 LAB — POCT URINALYSIS DIPSTICK
BILIRUBIN UA: NEGATIVE
Glucose, UA: NEGATIVE
LEUKOCYTES UA: NEGATIVE
Nitrite, UA: NEGATIVE
Protein, UA: NEGATIVE
Urobilinogen, UA: NEGATIVE
pH, UA: 7

## 2014-11-25 LAB — HEMOGLOBIN, FINGERSTICK: Hemoglobin, fingerstick: 13.5 g/dL (ref 12.0–16.0)

## 2014-11-25 LAB — TSH: TSH: 1.018 u[IU]/mL (ref 0.350–4.500)

## 2014-11-25 MED ORDER — NORETHIN ACE-ETH ESTRAD-FE 1-20 MG-MCG PO TABS: 1.0000 | ORAL_TABLET | Freq: Every day | ORAL | Status: DC

## 2014-11-25 NOTE — Telephone Encounter (Signed)
Encounter opened in error. Closing encounter.

## 2014-11-25 NOTE — Progress Notes (Addendum)
49 y.o. G0P0000 DivorcedCaucasianF here for annual exam.  Doing well.  Does have recurrent sinusitis that she sees Dr. Clent RidgesFry for when needed.  No LMP recorded. Patient is not currently having periods (Reason: Oral contraceptives).          Sexually active: No.  The current method of family planning is OCP (estrogen/progesterone).    Exercising: Yes.    walking 2-3 miles everyday, weights three times per week and abdominal exercises Smoker:  no  Health Maintenance: Pap:  09/09/13, Negative, 09/05/11 neg pap with neg HR HPV History of abnormal Pap:  no MMG:  09/30/14, Bi-Rads 1:  Negative Colonoscopy:  01/15/14, Normal, repeat in 10 years, Dr.Broide BMD:   Never TDaP:  2006 Screening Labs: today, Hb today: 13.5, Urine today: 2+ Ketones, small RBC   reports that she has never smoked. She has never used smokeless tobacco. She reports that she drinks alcohol. She reports that she does not use illicit drugs.  Past Medical History  Diagnosis Date  . GERD (gastroesophageal reflux disease)   . Allergy     sees Dr. Lucie LeatherKozlow  . Scoliosis     mild thoracolumnar-per Dr Lesia HausenKwiatowski note in [facility]  . Plantar fasciitis   . Hematuria     asymptomatic    Past Surgical History  Procedure Laterality Date  . Tonsillectomy    . Upper gi endoscopy      Current Outpatient Prescriptions  Medication Sig Dispense Refill  . loratadine (CLARITIN) 10 MG tablet Take 10 mg by mouth daily.    . [DISCONTINUED] zolpidem (AMBIEN) 10 MG tablet Take 10 mg by mouth at bedtime as needed.       No current facility-administered medications for this visit.    Family History  Problem Relation Age of Onset  . Colon cancer Neg Hx   . Heart disease Father   . Hypertension Brother   . Other Mother     AFIB    ROS:  Pertinent items are noted in HPI.  Otherwise, a comprehensive ROS was negative.  Exam:   BP 134/80 mmHg  Pulse 60  Ht 5' 4.25" (1.632 m)  Wt 155 lb (70.308 kg)  BMI 26.40 kg/m2   Height: 5' 4.25"  (163.2 cm)  Ht Readings from Last 3 Encounters:  11/25/14 5' 4.25" (1.632 m)  11/12/14 5\' 5"  (1.651 m)  02/11/14 5\' 5"  (1.651 m)    General appearance: alert, cooperative and appears stated age Head: Normocephalic, without obvious abnormality, atraumatic Neck: no adenopathy, supple, symmetrical, trachea midline and thyroid normal to inspection and palpation Lungs: clear to auscultation bilaterally Breasts: normal appearance, no masses or tenderness Heart: regular rate and rhythm Abdomen: soft, non-tender; bowel sounds normal; no masses,  no organomegaly Extremities: extremities normal, atraumatic, no cyanosis or edema Skin: Skin color, texture, turgor normal. No rashes or lesions Lymph nodes: Cervical, supraclavicular, and axillary nodes normal. No abnormal inguinal nodes palpated Neurologic: Grossly normal   Pelvic: External genitalia:  no lesions              Urethra:  normal appearing urethra with no masses, tenderness or lesions              Bartholins and Skenes: normal                 Vagina: normal appearing vagina with normal color and discharge, no lesions              Cervix: no lesions  Pap taken: No. Bimanual Exam:  Uterus:  normal size, contour, position, consistency, mobility, non-tender              Adnexa: normal adnexa and no mass, fullness, tenderness               Rectovaginal: Confirms               Anus:  normal sphincter tone, no lesions  Chaperone was present for exam.  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  Pt will call when needed. Pap with neg HR HPV 2014, neg pap 2015.  No pap today. TDAP today. ENT name and information given. AEX 1 year or follow up PRN

## 2014-11-25 NOTE — Patient Instructions (Signed)
Dr. Christia Readingwight Bates, [facility]  [facility] Main Office: [address] [place], [place] [address] [phone] phone [phone] fax

## 2014-11-26 LAB — VITAMIN D 25 HYDROXY (VIT D DEFICIENCY, FRACTURES): Vit D, 25-Hydroxy: 43 ng/mL (ref 30–100)

## 2015-02-02 ENCOUNTER — Other Ambulatory Visit: Payer: Self-pay | Admitting: Obstetrics & Gynecology

## 2015-02-02 DIAGNOSIS — Z20828 Contact with and (suspected) exposure to other viral communicable diseases: Secondary | ICD-10-CM

## 2015-02-02 MED ORDER — VALACYCLOVIR HCL 500 MG PO TABS: 500.0000 mg | ORAL_TABLET | Freq: Two times a day (BID) | ORAL | Status: DC

## 2015-02-02 NOTE — Telephone Encounter (Signed)
Patient is requesting a refill for Valtrex to be [facility] to [facility]. Patient also has some questions concerning the medication.

## 2015-02-02 NOTE — Telephone Encounter (Signed)
Spoke with patient. Patient states that she is in need of a refill of her Valtrex rx. Patient has a history of vulvar HSV. Was last seen on 11/25/2014 with Dr.Miller. Per OV note patient was to call when she needed a refill of her Valtrex. Rx for Valtrex 500 mg take 2 tablets for 3 days at the onset of symptoms #30 1RF sent to pharmacy on file. Patient denies any current symptoms. Patient is requesting to return to office for HSV testing. "My boyfriend was tested and is positive. I want to have the testing redone to see if I still need this medication. I just came in to see her. I just want the lab work." Advised with history of vulvar HSV titer will remain positive. Patient insists on returning to the office for lab work. Advised I will speak with Dr.Miller and return call with additional recommendations. Patient is agreeable.

## 2015-02-02 NOTE — Telephone Encounter (Signed)
Left message to call Kaitlyn at [phone]. 

## 2015-02-03 NOTE — Telephone Encounter (Signed)
Left message to call Kaitlyn at [phone].  Spoke with Dr.Miller okay for patient to return for HSV IGG and IGM lab work.

## 2015-02-03 NOTE — Telephone Encounter (Signed)
EPIC chart and paper chart reviewed.  No HSV testing in either chart.  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  Ok to check HSV1 and 2 IGG and IGM antibodies.  Order has been placed.  Rx for Valtrex was already sent into pharmacy.  Ok to close encounter.

## 2015-02-08 ENCOUNTER — Other Ambulatory Visit (INDEPENDENT_AMBULATORY_CARE_PROVIDER_SITE_OTHER): Payer: BC Managed Care – PPO

## 2015-02-08 DIAGNOSIS — Z20828 Contact with and (suspected) exposure to other viral communicable diseases: Secondary | ICD-10-CM

## 2015-02-08 NOTE — Telephone Encounter (Signed)
Spoke with patient. Advised of message as seen below from Dr.Miller. Patient is agreeable. Lab appointment scheduled for today 02/08/2015 at 1:30 pm . Agreeable to date and time.  Routing to provider for final review. Patient agreeable to disposition. Will close encounter.

## 2015-02-09 LAB — HSV(HERPES SMPLX)ABS-I+II(IGG+IGM)-BLD
HSV 1 GLYCOPROTEIN G AB, IGG: 7.79 IV — AB
HSV 2 Glycoprotein G Ab, IgG: <0.1 IV
Herpes Simplex Vrs I&II-IgM Ab (EIA): 0.74 INDEX

## 2015-02-14 ENCOUNTER — Telehealth: Payer: Self-pay

## 2015-02-14 NOTE — Telephone Encounter (Signed)
-----   Message from Jerene BearsMary S Miller, MD sent at 02/14/2015 12:14 PM EST ----- Please let pt know that the HSV 2 testing was negative.  Her HSV 1 testing was positive for an IGG antibody, showing an old exposure.  She has known hx of vulvar HSV so this testing is consistent with this.

## 2015-02-14 NOTE — Telephone Encounter (Signed)
Left message to call Devlyn Parish at [phone]. 

## 2015-02-14 NOTE — Addendum Note (Signed)
Addended by: Jerene BearsMILLER, Anaiz Qazi S on: 02/14/2015 12:13 PM   Modules accepted: Kipp BroodSmartSet

## 2015-02-16 NOTE — Telephone Encounter (Signed)
Spoke with patient. Results and message given as seen below from Dr.Miller. Patient is agreeable and verbalizes understanding.  Routing to provider for final review. Patient agreeable to disposition. Will close encounter.

## 2015-03-01 ENCOUNTER — Telehealth: Payer: Self-pay | Admitting: Family Medicine

## 2015-03-01 NOTE — Telephone Encounter (Signed)
She would need an OV for this  

## 2015-03-01 NOTE — Telephone Encounter (Signed)
Patient would like a prescription for a cough medication without codene for her cough and congestion.  She said she cannot sleep at night because of coughing.

## 2015-03-02 ENCOUNTER — Encounter: Payer: Self-pay | Admitting: Family Medicine

## 2015-03-02 ENCOUNTER — Ambulatory Visit (INDEPENDENT_AMBULATORY_CARE_PROVIDER_SITE_OTHER): Payer: BC Managed Care – PPO | Admitting: Family Medicine

## 2015-03-02 VITALS — BP 115/83 | HR 83 | Temp 98.6°F | Ht 64.25 in | Wt 144.0 lb

## 2015-03-02 DIAGNOSIS — J069 Acute upper respiratory infection, unspecified: Secondary | ICD-10-CM

## 2015-03-02 MED ORDER — HYDROCODONE-HOMATROPINE 5-1.5 MG/5ML PO SYRP: 5.0000 mL | ORAL_SOLUTION | ORAL | Status: DC | PRN

## 2015-03-02 NOTE — Progress Notes (Signed)
Pre visit review using our clinic review tool, if applicable. No additional management support is needed unless otherwise documented below in the visit note. 

## 2015-03-02 NOTE — Telephone Encounter (Signed)
Pt has been scheduled.  °

## 2015-03-02 NOTE — Progress Notes (Signed)
   Subjective:    Patient ID: Elizabeth [name], female    DOB: 1965/05/05, 50 y.o.   MRN: [id number]  HPI Here for 4 days of stuffy head, PND, and a dry cough. No ST or fever. Using Mucinex and Sudafed.    Review of Systems  Constitutional: Negative.   HENT: Positive for congestion and postnasal drip. Negative for ear pain, sinus pressure and sore throat.   Eyes: Negative.   Respiratory: Positive for cough.        Objective:   Physical Exam  Constitutional: She appears well-developed and well-nourished.  HENT:  Right Ear: External ear normal.  Left Ear: External ear normal.  Nose: Nose normal.  Mouth/Throat: Oropharynx is clear and moist.  Eyes: Conjunctivae are normal.  Neck: Neck supple.  Pulmonary/Chest: Effort normal and breath sounds normal.  Lymphadenopathy:    She has no cervical adenopathy.          Assessment & Plan:  Viral URI. Drink fluids. Use Hydromet prn

## 2015-03-22 ENCOUNTER — Encounter: Payer: Self-pay | Admitting: Family Medicine

## 2015-03-22 ENCOUNTER — Ambulatory Visit (INDEPENDENT_AMBULATORY_CARE_PROVIDER_SITE_OTHER): Payer: BC Managed Care – PPO | Admitting: Family Medicine

## 2015-03-22 VITALS — BP 142/96 | HR 79 | Temp 99.1°F | Ht 64.25 in | Wt 145.0 lb

## 2015-03-22 DIAGNOSIS — M5432 Sciatica, left side: Secondary | ICD-10-CM | POA: Diagnosis not present

## 2015-03-22 MED ORDER — CYCLOBENZAPRINE HCL 10 MG PO TABS: 10.0000 mg | ORAL_TABLET | Freq: Three times a day (TID) | ORAL | Status: DC | PRN

## 2015-03-22 MED ORDER — DICLOFENAC SODIUM 75 MG PO TBEC: 75.0000 mg | DELAYED_RELEASE_TABLET | Freq: Two times a day (BID) | ORAL | Status: DC

## 2015-03-22 MED ORDER — HYDROCODONE-HOMATROPINE 5-1.5 MG/5ML PO SYRP: 5.0000 mL | ORAL_SOLUTION | ORAL | Status: DC | PRN

## 2015-03-22 NOTE — Progress Notes (Signed)
   Subjective:    Patient ID: Elizabeth [name], female    DOB: 11-08-1965, 50 y.o.   MRN: [id number]  HPI Here for the onset yesterday of sharp pains in the left lower back and left hip that radiates down to the left knee. No recent trauma. Ibuprofen helps for awhile.    Review of Systems  Constitutional: Negative.   Musculoskeletal: Positive for back pain and gait problem.       Objective:   Physical Exam  Constitutional: She appears well-developed and well-nourished.  Musculoskeletal:  Tender in the left lower back and over the left sciatic notch. Full ROM          Assessment & Plan:  Sciatica, rest and heat. Try Diclofenac and Flexeril.

## 2015-03-22 NOTE — Progress Notes (Signed)
Pre visit review using our clinic review tool, if applicable. No additional management support is needed unless otherwise documented below in the visit note. 

## 2015-04-29 ENCOUNTER — Telehealth: Payer: Self-pay | Admitting: *Deleted

## 2015-04-29 NOTE — Telephone Encounter (Signed)
Spoke with patient. Patient states that she needs a letter stating she was on birth control from 2002 until the year she divorced her husband. Advised patient we are unable to write a letter of this nature, but that she may pick up her medical records from the office. Advised this is provide documentation that birth control was prescribed. She is agreeable.  Routing to provider for final review. Patient agreeable to disposition. Will close encounter.

## 2015-04-29 NOTE — Telephone Encounter (Signed)
Patient calling needing a statement stating that [facility]. Patient says she can pick it up or she will call us back to provide a fax #. Best # to reach: [phone]

## 2015-05-02 ENCOUNTER — Ambulatory Visit (INDEPENDENT_AMBULATORY_CARE_PROVIDER_SITE_OTHER): Payer: BC Managed Care – PPO | Admitting: Family Medicine

## 2015-05-02 ENCOUNTER — Encounter: Payer: Self-pay | Admitting: Family Medicine

## 2015-05-02 VITALS — BP 113/85 | HR 89 | Temp 98.5°F | Ht 64.25 in | Wt 143.0 lb

## 2015-05-02 DIAGNOSIS — L259 Unspecified contact dermatitis, unspecified cause: Secondary | ICD-10-CM

## 2015-05-02 MED ORDER — TRIAMCINOLONE ACETONIDE 0.1 % EX CREA: 1.0000 "application " | TOPICAL_CREAM | Freq: Three times a day (TID) | CUTANEOUS | Status: DC

## 2015-05-02 NOTE — Progress Notes (Signed)
Pre visit review using our clinic review tool, if applicable. No additional management support is needed unless otherwise documented below in the visit note. 

## 2015-05-02 NOTE — Progress Notes (Signed)
   Subjective:    Patient ID: Oval LinseySidonie K Sardinas, female    DOB: 06/06/1965, 50 y.o.   MRN: [id number]  HPI Here for an itchy rash scattered on her arms, legs , and trunk which appeared over the weekend. Of note she had helped her boyfriend unload freshly cut logs off a truck.    Review of Systems  Constitutional: Negative.   Skin: Positive for rash.       Objective:   Physical Exam  Constitutional: She appears well-developed and well-nourished.  Skin:  Scattered patches of red vesicles and papules as above, some with a linear distribution          Assessment & Plan:  Contact dermatitis. Use Triamcinolone cream prn

## 2015-06-10 ENCOUNTER — Encounter: Payer: Self-pay | Admitting: Family Medicine

## 2015-06-10 ENCOUNTER — Ambulatory Visit (INDEPENDENT_AMBULATORY_CARE_PROVIDER_SITE_OTHER): Payer: BC Managed Care – PPO | Admitting: Family Medicine

## 2015-06-10 VITALS — BP 139/93 | HR 79 | Temp 99.0°F | Ht 64.25 in | Wt 144.0 lb

## 2015-06-10 DIAGNOSIS — R509 Fever, unspecified: Secondary | ICD-10-CM

## 2015-06-10 DIAGNOSIS — Z209 Contact with and (suspected) exposure to unspecified communicable disease: Secondary | ICD-10-CM | POA: Diagnosis not present

## 2015-06-10 DIAGNOSIS — R319 Hematuria, unspecified: Secondary | ICD-10-CM

## 2015-06-10 LAB — CBC WITH DIFFERENTIAL/PLATELET
BASOS ABS: 39 {cells}/uL (ref 0–200)
BASOS PCT: 1 %
EOS ABS: 78 {cells}/uL (ref 15–500)
Eosinophils Relative: 2 %
HEMATOCRIT: 35.7 % (ref 35.0–45.0)
HEMOGLOBIN: 12.4 g/dL (ref 11.7–15.5)
Lymphocytes Relative: 34 %
Lymphs Abs: 1326 cells/uL (ref 850–3900)
MCH: 31.5 pg (ref 27.0–33.0)
MCHC: 34.7 g/dL (ref 32.0–36.0)
MCV: 90.6 fL (ref 80.0–100.0)
MONO ABS: 390 {cells}/uL (ref 200–950)
MPV: 8.8 fL (ref 7.5–12.5)
Monocytes Relative: 10 %
NEUTROS ABS: 2067 {cells}/uL (ref 1500–7800)
Neutrophils Relative %: 53 %
Platelets: 231 10*3/uL (ref 140–400)
RBC: 3.94 MIL/uL (ref 3.80–5.10)
RDW: 12.5 % (ref 11.0–15.0)
WBC: 3.9 10*3/uL (ref 3.8–10.8)

## 2015-06-10 LAB — POC URINALSYSI DIPSTICK (AUTOMATED)
BILIRUBIN UA: NEGATIVE
GLUCOSE UA: NEGATIVE
KETONES UA: NEGATIVE
LEUKOCYTES UA: NEGATIVE
NITRITE UA: NEGATIVE
PH UA: 6
Protein, UA: NEGATIVE
Spec Grav, UA: >=1.03
Urobilinogen, UA: 0.2

## 2015-06-10 MED ORDER — DOXYCYCLINE HYCLATE 100 MG PO CAPS: 100.0000 mg | ORAL_CAPSULE | Freq: Two times a day (BID) | ORAL | Status: AC

## 2015-06-10 NOTE — Progress Notes (Signed)
   Subjective:    Patient ID: Elizabeth [name], female    DOB: 05/22/1965, 50 y.o.   MRN: [id number]  HPI Here for several issues. First she wants to be screened for [name] few infections since her boyfriend has [name] hx of hepatitis C. He feels fine now. Also she has had fevers to 100.7 degrees for 2 days and she noticed [name] rash on the back last night. No URI symptoms. She has had some urinary burning lately.   Review of Systems  Constitutional: Positive for fever.  HENT: Negative.   Eyes: Negative.   Respiratory: Negative.   Cardiovascular: Negative.   Gastrointestinal: Negative.   Genitourinary: Positive for dysuria. Negative for flank pain.  Musculoskeletal: Negative for arthralgias.  Skin: Positive for rash.       Objective:   Physical Exam  Constitutional: She is oriented to person, place, and time. She appears well-developed and well-nourished. No distress.  HENT:  Right Ear: External ear normal.  Left Ear: External ear normal.  Nose: Nose normal.  Mouth/Throat: Oropharynx is clear and moist. No oropharyngeal exudate.  Eyes: Conjunctivae are normal. No scleral icterus.  Neck: Neck supple. No thyromegaly present.  Cardiovascular: Normal rate, regular rhythm, normal heart sounds and intact distal pulses.   Pulmonary/Chest: Effort normal and breath sounds normal.  Abdominal: Soft. Bowel sounds are normal. She exhibits no distension. There is no tenderness. There is no rebound and no guarding.  Lymphadenopathy:    She has no cervical adenopathy.  Neurological: She is alert and oriented to person, place, and time.  Skin:  There is [name] small punctate wound consistent with an insect bite on the middle upper back. This is surrounded by [name] zone of macular erythema and petechiae          Assessment & Plan:  Fever and rash after [name] recent bite, possibly from [name] tick. Possibilities include rickettsial disease or Erlichiosis, so we will cover with Doxycycline. Refer to Urology for the  hematuria.  Elizabeth SalisburyFRY,Elizabeth [name] [name], Elizabeth [name]

## 2015-06-10 NOTE — Addendum Note (Signed)
Addended by: Aniceto BossNIMMONS, Renne Platts A on: 06/10/2015 04:05 PM   Modules accepted: Orders

## 2015-06-10 NOTE — Progress Notes (Signed)
Pre visit review using our clinic review tool, if applicable. No additional management support is needed unless otherwise documented below in the visit note. 

## 2015-06-10 NOTE — Addendum Note (Signed)
Addended by: Baldwin CrownJOHNSON, Toshiko Kemler D on: 06/10/2015 04:12 PM   Modules accepted: Orders

## 2015-06-11 LAB — HEPATITIS C ANTIBODY: HCV Ab: NEGATIVE

## 2015-06-11 LAB — HIV ANTIBODY (ROUTINE TESTING W REFLEX): HIV: NONREACTIVE

## 2015-06-12 LAB — URINE CULTURE
COLONY COUNT: NO GROWTH
ORGANISM ID, BACTERIA: NO GROWTH

## 2015-06-24 ENCOUNTER — Ambulatory Visit (INDEPENDENT_AMBULATORY_CARE_PROVIDER_SITE_OTHER): Payer: BC Managed Care – PPO | Admitting: Family Medicine

## 2015-06-24 ENCOUNTER — Encounter: Payer: Self-pay | Admitting: Family Medicine

## 2015-06-24 VITALS — BP 120/93 | HR 90 | Temp 99.1°F | Wt 139.0 lb

## 2015-06-24 DIAGNOSIS — W57XXXA Bitten or stung by nonvenomous insect and other nonvenomous arthropods, initial encounter: Secondary | ICD-10-CM

## 2015-06-24 DIAGNOSIS — T148 Other injury of unspecified body region: Secondary | ICD-10-CM | POA: Diagnosis not present

## 2015-06-24 NOTE — Progress Notes (Signed)
Pre visit review using our clinic review tool, if applicable. No additional management support is needed unless otherwise documented below in the visit note. 

## 2015-06-24 NOTE — Progress Notes (Signed)
   Subjective:    Patient ID: Elizabeth [name], female    DOB: 05/29/1965, 50 y.o.   MRN: [id number]  HPI Here to ask about a bite spot on her back. We saw her 2 weeks ago for a probable tick bite on the back. At that time she had red streaks around the spot and it was tender. She took 10 days of Doxycyline. She feels fine now but a friend was worried about the spot and she asks me to check it again.    Review of Systems  Constitutional: Negative.        Objective:   Physical Exam  Constitutional: She appears well-developed and well-nourished.  Skin:  The upper central back as a small nodular lesion which is not tender. There is no rash and no streaking anywhere on the back.           Assessment & Plan:  I reassured her that the area is healing exactly as expected. All inflammation has resolved. The lump itself will slowly disappear over the next few weeks.  [name],Tamasha Laplante A, MD

## 2015-07-27 ENCOUNTER — Encounter: Payer: Self-pay | Admitting: Certified Nurse Midwife

## 2015-07-27 ENCOUNTER — Telehealth: Payer: Self-pay | Admitting: Obstetrics & Gynecology

## 2015-07-27 ENCOUNTER — Ambulatory Visit (INDEPENDENT_AMBULATORY_CARE_PROVIDER_SITE_OTHER): Payer: BC Managed Care – PPO | Admitting: Certified Nurse Midwife

## 2015-07-27 VITALS — BP 120/80 | HR 68 | Resp 16 | Ht 65.0 in | Wt 141.0 lb

## 2015-07-27 DIAGNOSIS — T148 Other injury of unspecified body region: Secondary | ICD-10-CM

## 2015-07-27 DIAGNOSIS — W57XXXA Bitten or stung by nonvenomous insect and other nonvenomous arthropods, initial encounter: Secondary | ICD-10-CM | POA: Diagnosis not present

## 2015-07-27 NOTE — Telephone Encounter (Signed)
Return call to patient. [facility] and is on her way home now. She has small bites in bikini line area and pubic area. She feels she saw bugs in the rental home she stayed at for one week, but is unsure of what type. She is scheduled for office visit today with Leota Sauerseborah Leonard CNM at 1245 and agreeable.  Routing to provider for final review. Patient agreeable to disposition. Will close encounter.

## 2015-07-27 NOTE — Progress Notes (Signed)
50 y.o. [name] here with complaint ? Bug bites in suprapubic area and lower abdominal skin area. Patient noted 3 days ago and occurred [place]. House was checked for bed bugs by exterminator and none seen. She also has seen mites in bathroom and this was checked and were found by exterminator and checked non biting mites. Dad and friend have bites also. Has used Lotrimin cream with no relief.Denies fever, chills or pustules. Patient feels like things were crawling with the itching. Has had fleas before also. Saw no ticks.. No other health issues today.  O:Healthy female WDWN Affect: normal, orientation x 3  Exam:Skin warm and dry Scalp: no nits or scabies or louse noted with magnify glass, sample hair shaft taken Abdomen: small pinpoint red bite appearance on lower abdomen and at edge of umbilicus, non tender, scratched appearance noted, no pustules, small skin scraping collected  Inguinal Lymph nodes: no enlargement or tenderness Pelvic exam: External genital: normal female, no bites noted Upper buttock area same small pinpoint red bite appearance, non tender, scratched appearance.  No further pelvic exam done  Microscopic view: hair shaft negative for louse,nits  Skin sample: no scabies noted or insect movement noted   A:? Fleas bites vs Other insect, no active insect noted on magnification    P:Discussed findings of appearance of flea bites or red bug bites and ? etiology. Discussed Aveeno or  bath for comfort and Sarna OTC lotion for itching.Benadryl per OTC directions prn itching. If not resolving or new areas, needs to see dermatology. Avoid scratching if possible to prevent secondary infection. If systemic symptoms occur needs to seek PCP or ER. Patient voiced understanding.  Rv prn

## 2015-07-27 NOTE — Telephone Encounter (Signed)
Patient is traveling back from the beach and wondering if she needs an appointment at our office or PCP. Patient said she has "tiny bites on pubic area and abdomen". Patient noticed "tiny bugs on the toilet seat".

## 2015-07-27 NOTE — Progress Notes (Signed)
Reviewed personally.  M. Suzanne Ramah Langhans, MD.  

## 2015-07-27 NOTE — Patient Instructions (Signed)
Insect Bite °Mosquitoes, flies, fleas, bedbugs, and other insects can bite. Insect bites are different from insect stings. The bite may be red, puffy (swollen), and itchy for 2 to 4 days. Most bites get better on their own. °HOME CARE  °· Do not scratch the bite. °· Keep the bite clean and dry. Wash the bite with soap and water every day, as told by your doctor. °· If directed, apply ice to the bite area. °¨ Put ice in a plastic bag. °¨ Place a towel between your skin and the bag. °¨ Leave the ice on for 20 minutes, 2-3 times per day. °· Follow instructions from your doctor about using medicated lotions or creams. These can help with itching. °· Apply or take over-the-counter and prescription medicines only as told by your doctor. °· If you were given an antibiotic medicine, use it as told by your doctor. Do not stop using the medicine even if your condition improves. °· Keep all follow-up visits as told by your doctor. This is important. °GET HELP IF: °· You have redness, swelling (inflammation), or pain near your bite that is getting worse. °· You have a fever. °GET HELP RIGHT AWAY IF:  °· You have joint pain.   °· You have fluid, blood, or pus coming from the bite area.   °· You have a headache. °· You have neck pain. °· You feel weaker than you normally do.   °· You have a rash.   °· You have chest pain. °· You have shortness of breath. °· You have stomach pain, feel sick to your stomach (nauseous), or throw up (vomit). °· You feel more tired or sleepy than you normally do. °  °This information is not intended to replace advice given to you by your health care provider. Make sure you discuss any questions you have with your health care provider. °  °Document Released: 01/20/2000 Document Revised: 10/13/2014 Document Reviewed: 06/09/2014 °Elsevier Interactive Patient Education ©2016 [employer] Inc. ° °

## 2015-08-23 ENCOUNTER — Ambulatory Visit (INDEPENDENT_AMBULATORY_CARE_PROVIDER_SITE_OTHER): Payer: BC Managed Care – PPO | Admitting: Family Medicine

## 2015-08-23 ENCOUNTER — Encounter: Payer: Self-pay | Admitting: Family Medicine

## 2015-08-23 VITALS — BP 119/70 | HR 89 | Temp 98.6°F | Ht 65.0 in | Wt 138.0 lb

## 2015-08-23 DIAGNOSIS — M545 Low back pain, unspecified: Secondary | ICD-10-CM

## 2015-08-23 MED ORDER — HYDROCODONE-ACETAMINOPHEN 5-325 MG PO TABS: 1.0000 | ORAL_TABLET | Freq: Four times a day (QID) | ORAL | Status: DC | PRN

## 2015-08-23 MED ORDER — CYCLOBENZAPRINE HCL 10 MG PO TABS: 10.0000 mg | ORAL_TABLET | Freq: Three times a day (TID) | ORAL | Status: DC | PRN

## 2015-08-23 MED ORDER — METHYLPREDNISOLONE 4 MG PO TBPK: ORAL_TABLET | ORAL | Status: DC

## 2015-08-23 NOTE — Progress Notes (Signed)
Pre visit review using our clinic review tool, if applicable. No additional management support is needed unless otherwise documented below in the visit note. 

## 2015-08-23 NOTE — Progress Notes (Signed)
   Subjective:    Patient ID: Elizabeth [name], female    DOB: 04/13/1965, 50 y.o.   MRN: [id number]  HPI Here for the sudden onset of pain in the lower back 4 days ago as she was helping a friend move boxes into a new apartment. Since then the pain as not improved and she has developed more stiffness in the back. The pain is centered in the middle portion of the lower back. No radiation to the legs. Using Ibuprofen.   Review of Systems  Constitutional: Negative.   Musculoskeletal: Positive for back pain.  Neurological: Negative for weakness and numbness.       Objective:   Physical Exam  Constitutional: She is oriented to person, place, and time.  In obvious pain, walks slowly   Cardiovascular: Normal rate, regular rhythm, normal heart sounds and intact distal pulses.   Pulmonary/Chest: Effort normal and breath sounds normal.  Abdominal: Soft. Bowel sounds are normal.  Musculoskeletal:  Very tender over the lower central back with spasm. ROM is limited by pain. SLR are negative  Neurological: She is alert and oriented to person, place, and time.          Assessment & Plan:  Low back pain. Use heat and stretches. Given Norco, Flexeril, and a Medrol dose pack. Set up for lumbar Xrays soon.  [name],STEPHEN A, MD

## 2015-08-24 ENCOUNTER — Ambulatory Visit (INDEPENDENT_AMBULATORY_CARE_PROVIDER_SITE_OTHER)
Admission: RE | Admit: 2015-08-24 | Discharge: 2015-08-24 | Disposition: A | Discharge status: Home / Self Care | Payer: BC Managed Care – PPO | Source: Ambulatory Visit | Attending: Family Medicine | Admitting: Family Medicine

## 2015-08-24 DIAGNOSIS — M545 Low back pain, unspecified: Secondary | ICD-10-CM

## 2015-08-25 ENCOUNTER — Telehealth: Payer: Self-pay | Admitting: Family Medicine

## 2015-08-25 NOTE — Telephone Encounter (Signed)
Pt calling to get the results from her xray that was done on 08/24/15 let pt know that Dr. Clent RidgesFry was not in the office will return on 7/21 in the a.m.

## 2015-08-26 ENCOUNTER — Telehealth: Payer: Self-pay | Admitting: Family Medicine

## 2015-08-26 NOTE — Telephone Encounter (Signed)
[facility] - Client  TELEPHONE ADVICE RECORD   [facility]     Patient Name: Elizabeth [name]  Client [facility]    Client Site [facility]    Physician Gershon Crane - MD    Contact Type Call    Who Is Calling Patient / Member / Family / Caregiver    Call Type Triage / Clinical    Relationship To Patient Self    Return Phone Number [phone] (Primary)    Chief Complaint Medication reaction  Gender: Female Reason for Call Symptomatic / Request for Health Information  DOB: [birth year]  Initial Comment Caller states saw Dr on [facility] and every since being on medicine hasn't had a bowel movement.   Age: 50 Y 6 M 16 D Appointment Disposition EMR Appointment Not Necessary  Return Phone Number: [phone] (Primary) Info pasted into Epic Yes  Address:  PreDisposition Call Doctor  City/State/Zip: [place]  Translation No    Nurse Assessment  Nurse: Logan Bores, RN, Melissa Date/Time (Eastern Time): 08/26/2015 3:52:32 PM  Confirm and document reason for call. If symptomatic, describe symptoms. You must click the next button to save text entered. ---Caller states saw Dr on [facility] and every since being on medicine hasn't had a bowel movement.  Has the patient traveled out of the country within the last 30 days? ---Not Applicable  Does the patient have any new or worsening symptoms? ---Yes  Will a triage be completed? ---Yes  Related visit to physician within the last 2 weeks? ---Yes  Does the PT have any chronic conditions? (i.e. diabetes, asthma, etc.) ---Yes  List chronic conditions. ---back problems-  Is the patient pregnant or possibly pregnant? (Ask all females between the ages of 85-55) ---No  Is this a behavioral health or substance abuse call? ---No    Guidelines      Guideline Title Affirmed Question Affirmed Notes Nurse Date/Time Lamount Cohen Time)  Constipation Taking new prescription  medication  Logan Bores, RN, Melissa 08/26/2015 3:53:59 PM  Disp. Time Lamount Cohen Time) Disposition Final User         08/26/2015 3:58:25 PM Call PCP when Office is Open Yes Logan Bores, RN, Candise Bowens Understands: Yes   Disagree/Comply: Comply      Care Advice Given Per Guideline         CALL PCP WHEN OFFICE IS OPEN: You need to discuss this with your doctor within the next few days. Call him/her during regular office hours. GENERAL CONSTIPATION INSTRUCTIONS: * Eat a high fiber diet. * Drink adequate liquids. * Try to eat fruit and vegetables at each meal (peas, prunes, citrus, apples, beans, corn). * Eat more grain foods (bran flakes, bran muffins, graham crackers, oatmeal, brown rice, and whole wheat bread. * Drink 6-8 glasses of water a day. (Caution: certain medical conditions require fluid restriction) * Adequate liquid intake is important to keep your BM's soft. LIQUIDS: * Prune juice is a natural laxative. BULK LAXATIVES: * METAMUCIL (psyllium fiber): One teaspoon (5 cc) in a glass of water twice daily. * Bulk-forming agents works like fiber to help soften the stools and improve your intestinal function. Long-term use of this type of laxative is generally safe. OSMOTIC LAXATIVES: * MIRALAX (polyethylene glycol 3350): Miralax is an 'osmotic' agent which means that it binds water and causes water to be retained within the stool.You can use this laxative  to treat occasional constipation. Do not use for more than 2 weeks without approval from your doctor. Generally, Miralax produces a bowel movement in 1 to 3 days. Side effects include diarrhea (especially at higher doses). If you are pregnant, discuss with your doctor before using. Available in the [place]. * MILK OF MAGNESIA (magnesium hydroxide): This is a mild and generally safe laxative. You can use Milk of Magnesia for short-term treatment of constipation. (Research suggests that Miralax may be more effective.) Dosage is 2 tablespoons (30 cc)  PO. Do not use if you have kidney disease. * Be certain to read the package instructions. CALL BACK IF: * You become worse. * No BM for over 4 days * Constant or increasing abdominal pain * Abdominal swelling, vomiting or fever occur             Referrals   REFERRED TO PCP OFFICE

## 2015-08-26 NOTE — Telephone Encounter (Signed)
I spoke with pt and went over results from xray.  

## 2015-09-05 ENCOUNTER — Telehealth: Payer: Self-pay | Admitting: Family Medicine

## 2015-09-05 MED ORDER — HYDROCODONE-ACETAMINOPHEN 5-325 MG PO TABS: 1.0000 | ORAL_TABLET | Freq: Four times a day (QID) | ORAL | 0 refills | Status: DC | PRN

## 2015-09-05 NOTE — Telephone Encounter (Signed)
done

## 2015-09-05 NOTE — Telephone Encounter (Signed)
Script is ready for pick up here at front office and I spoke with pt.  

## 2015-09-05 NOTE — Telephone Encounter (Signed)
Pt request refill  HYDROcodone-acetaminophen (NORCO) 5-325 MG tablet  Pt states her back is better, but would like one more refill if ok.

## 2015-10-31 ENCOUNTER — Encounter: Payer: Self-pay | Admitting: Nurse Practitioner

## 2015-10-31 ENCOUNTER — Ambulatory Visit (INDEPENDENT_AMBULATORY_CARE_PROVIDER_SITE_OTHER): Payer: BC Managed Care – PPO | Admitting: Nurse Practitioner

## 2015-10-31 VITALS — BP 120/80 | HR 88 | Resp 20 | Ht 65.0 in | Wt 136.4 lb

## 2015-10-31 DIAGNOSIS — N9089 Other specified noninflammatory disorders of vulva and perineum: Secondary | ICD-10-CM | POA: Diagnosis not present

## 2015-10-31 MED ORDER — VALACYCLOVIR HCL 1 G PO TABS: 1000.0000 mg | ORAL_TABLET | Freq: Two times a day (BID) | ORAL | 0 refills | Status: DC

## 2015-10-31 NOTE — Patient Instructions (Signed)
We will call you with test results

## 2015-10-31 NOTE — Progress Notes (Signed)
50 y.o. [name] here with complaint of vaginal symptoms of "bump" that was noted with showering last pm.  It did not hurt but felt uncomfortable.  Her partner has history of HSV and takes Valtrex prn for flare.  Last SA 2 days ago.  No increase in vaginal dryness and did not have to use lubrication.   Onset of symptoms last pm.   Denies new personal products or vaginal dryness.    No STD concerns. Urinary symptoms none . Contraception is OCP.  [place] for [place] in [place] with her mother and friends and will be gone for 10 days.   O:  Healthy female WDWN Affect: normal, orientation x 3  Exam: no distress Abdomen: soft and non tender Lymph node: no enlargement or tenderness Pelvic exam: External genital: normal female with a lesion inner left labia that could be an early outbreak of HSV vs. Irritation.  Culture is done Vagina: no discharge noted.     A: Vulvar lesion  R/O HSV  P: Discussed findings of vaginitis and etiology. Discussed Aveeno or baking soda sitz bath for comfort. Avoid moist clothes or pads for extended period of time.  Olive Oil/Coconut Oil use for skin protection prior to activity can be used to external skin.  Rx: Valtrex 1 GM BID until lab results are back.  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   RV prn

## 2015-11-02 LAB — HERPES SIMPLEX VIRUS CULTURE: ORGANISM ID, BACTERIA: NOT DETECTED

## 2015-11-03 ENCOUNTER — Telehealth: Payer: Self-pay

## 2015-11-03 NOTE — Telephone Encounter (Signed)
-----   Message from Verner Choleborah S Leonard, CNM sent at 11/03/2015  7:50 AM EDT ----- Notify patient herpes culture was negative

## 2015-11-03 NOTE — Progress Notes (Signed)
Encounter reviewed by Dr. Arihaan Bellucci Amundson C. Silva.  

## 2015-11-03 NOTE — Telephone Encounter (Signed)
Spoke with patient about lab results. -sco

## 2015-11-03 NOTE — Telephone Encounter (Signed)
Tried to notify patient of results. Voicemail is full and cannot leave a message. Will try again. -sco

## 2015-12-17 ENCOUNTER — Other Ambulatory Visit: Payer: Self-pay | Admitting: Obstetrics & Gynecology

## 2015-12-19 ENCOUNTER — Other Ambulatory Visit: Payer: Self-pay | Admitting: Obstetrics & Gynecology

## 2015-12-19 DIAGNOSIS — Z1231 Encounter for screening mammogram for malignant neoplasm of breast: Secondary | ICD-10-CM

## 2015-12-19 NOTE — Telephone Encounter (Signed)
[facility] on JUNEL FE 1/20 Last filled - 11/25/14, #3 X 4 REFILLS Last AEX - 11/25/14 Next AEX - 01/03/15 Last MMG - 09/30/14, Bi-Rads 1:  Negative  I spoke to the patient to let her know we had received the request to refill her medication.  Patient states she will run out prior to annual exam on 01/03/16.  Advised patient she is also past due on her mammogram and this will need to be scheduled to receive refills.  Patient will call [facility] to schedule.  Please advise refill.

## 2015-12-19 NOTE — Telephone Encounter (Signed)
One RF done.  Will make sure pt has MMG done at [facility].  Thanks.

## 2015-12-20 ENCOUNTER — Ambulatory Visit
Admission: RE | Admit: 2015-12-20 | Discharge: 2015-12-20 | Disposition: A | Discharge status: Home / Self Care | Payer: BC Managed Care – PPO | Source: Ambulatory Visit | Attending: Obstetrics & Gynecology | Admitting: Obstetrics & Gynecology

## 2015-12-20 DIAGNOSIS — Z1231 Encounter for screening mammogram for malignant neoplasm of breast: Secondary | ICD-10-CM

## 2016-01-03 ENCOUNTER — Encounter: Payer: Self-pay | Admitting: Obstetrics & Gynecology

## 2016-01-03 ENCOUNTER — Ambulatory Visit (INDEPENDENT_AMBULATORY_CARE_PROVIDER_SITE_OTHER): Payer: BC Managed Care – PPO | Admitting: Obstetrics & Gynecology

## 2016-01-03 VITALS — BP 118/76 | HR 86 | Resp 14 | Ht 65.0 in | Wt 140.0 lb

## 2016-01-03 DIAGNOSIS — Z124 Encounter for screening for malignant neoplasm of cervix: Secondary | ICD-10-CM | POA: Diagnosis not present

## 2016-01-03 DIAGNOSIS — Z01419 Encounter for gynecological examination (general) (routine) without abnormal findings: Secondary | ICD-10-CM | POA: Diagnosis not present

## 2016-01-03 DIAGNOSIS — N912 Amenorrhea, unspecified: Secondary | ICD-10-CM | POA: Diagnosis not present

## 2016-01-03 MED ORDER — NORETHIN ACE-ETH ESTRAD-FE 1-20 MG-MCG PO TABS: 1.0000 | ORAL_TABLET | Freq: Every day | ORAL | 4 refills | Status: DC

## 2016-01-03 MED ORDER — VALACYCLOVIR HCL 1 G PO TABS: 1000.0000 mg | ORAL_TABLET | Freq: Every day | ORAL | 5 refills | Status: DC

## 2016-01-03 NOTE — Progress Notes (Signed)
50 y.o. G0P0000 DivorcedCaucasianF here for annual exam.  Doing well.  Had issues with bird mites in parent's beach home so had issues with bites for months that she finally figured out was bird mites.    Had sciatica earlier this year.  Is much better.    Denies vaginal bleeding.    No LMP recorded. Patient is not currently having periods (Reason: Oral contraceptives).          Sexually active: Yes.    The current method of family planning is OCP (estrogen/progesterone).    Exercising: Yes.    walking Smoker:  no  Health Maintenance: Pap:  09/09/13 negative  History of abnormal Pap:  no MMG:  12/22/15 BIRADS 1 negative  Colonoscopy:  12/15 Dr. Juanda ChanceBrodie- normal- repeat 10 years  BMD:   never TDaP:  11/25/14  Pneumonia vaccine(s):  never Zostavax:   never Hep C testing: 06/10/15, negative Screening Labs: UTD with PCP and lab work done last year   reports that she has never smoked. She has never used smokeless tobacco. She reports that she drinks alcohol. She reports that she does not use drugs.  Past Medical History:  Diagnosis Date  . Allergy    sees Dr. Lucie LeatherKozlow  . GERD (gastroesophageal reflux disease)   . Hematuria    asymptomatic  . Plantar fasciitis   . Scoliosis    mild thoracolumnar-per Dr Lesia HausenKwiatowski note in [facility]    Past Surgical History:  Procedure Laterality Date  . TONSILLECTOMY    . UPPER GI ENDOSCOPY      Current Outpatient Prescriptions  Medication Sig Dispense Refill  . aspirin 81 MG tablet Take 81 mg by mouth daily.    . cyclobenzaprine (FLEXERIL) 10 MG tablet Take 1 tablet (10 mg total) by mouth 3 (three) times daily as needed for muscle spasms. 60 tablet 2  . HYDROcodone-acetaminophen (NORCO) 5-325 MG tablet Take 1 tablet by mouth every 6 (six) hours as needed for moderate pain. 30 tablet 0  . JUNEL FE 1/20 1-20 MG-MCG tablet TAKE 1 TABLET ONCE DAILY. 84 tablet 0  . loratadine (CLARITIN) 10 MG tablet Take 10 mg by mouth daily as needed. Reported on  07/27/2015    . methylPREDNISolone (MEDROL DOSEPAK) 4 MG TBPK tablet As directed (Patient taking differently: as needed. As directed) 21 tablet 0  . valACYclovir (VALTREX) 1000 MG tablet Take 1 tablet (1,000 mg total) by mouth 2 (two) times daily. 60 tablet 0  . valACYclovir (VALTREX) 500 MG tablet Take 1 tablet (500 mg total) by mouth 2 (two) times daily. For three days with the onset of symptoms. 30 tablet 1   No current facility-administered medications for this visit.     Family History  Problem Relation Age of Onset  . Colon cancer Neg Hx   . Heart disease Father   . Hypertension Brother   . Other Mother     AFIB    ROS:  Pertinent items are noted in HPI.  Otherwise, a comprehensive ROS was negative.  Exam:   BP 118/76 (BP Location: Right Arm, Patient Position: Sitting, Cuff Size: Normal)   Pulse 86   Resp 14   Ht 5\' 5"  (1.651 m)   Wt 140 lb (63.5 kg)   BMI 23.30 kg/m      Height: 5\' 5"  (165.1 cm)  Ht Readings from Last 3 Encounters:  01/03/16 5\' 5"  (1.651 m)  10/31/15 5\' 5"  (1.651 m)  08/23/15 5\' 5"  (1.651 m)   General appearance:  alert, cooperative and appears stated age Head: Normocephalic, without obvious abnormality, atraumatic Neck: no adenopathy, supple, symmetrical, trachea midline and thyroid normal to inspection and palpation Lungs: clear to auscultation bilaterally Breasts: normal appearance, no masses or tenderness Heart: regular rate and rhythm Abdomen: soft, non-tender; bowel sounds normal; no masses,  no organomegaly Extremities: extremities normal, atraumatic, no cyanosis or edema Skin: Skin color, texture, turgor normal. No rashes or lesions Lymph nodes: Cervical, supraclavicular, and axillary nodes normal. No abnormal inguinal nodes palpated Neurologic: Grossly normal   Pelvic: External genitalia:  no lesions              Urethra:  normal appearing urethra with no masses, tenderness or lesions              Bartholins and Skenes: normal                  Vagina: normal appearing vagina with normal color and discharge, no lesions              Cervix: no lesions              Pap taken: Yes.   Bimanual Exam:  Uterus:  normal size, contour, position, consistency, mobility, non-tender              Adnexa: normal adnexa and no mass, fullness, tenderness               Rectovaginal: Confirms               Anus:  normal sphincter tone, no lesions  Chaperone was present for exam.  A:  Well Woman with normal exam On OCPs for contraception H/o HSV 2 H/O microscopic hematuria.  Saw [facility] for this. (I do not have notes but pt reports evaluation was completely negative.)  P: Mammogram guidelines reviewed. Pap and HR HPV obtained today Rx Gildess 1/20 Fe sent to pharmacy.   Will check FSH and if elevated, can consider transition to HRT.  Pt states she does NOT want to be without some form of hormonal therapy.  Risks of breast cancer, MI, stroke, DVT/PE, breast cancer reviewed Valtrex 1000mg  to be used bid x 3 days with outbreaks [facility] 1 year or follow up P

## 2016-01-04 LAB — FOLLICLE STIMULATING HORMONE: FSH: 17.6 m[IU]/mL

## 2016-01-05 LAB — IPS PAP TEST WITH HPV

## 2016-04-05 ENCOUNTER — Ambulatory Visit: Payer: BC Managed Care – PPO | Admitting: Obstetrics & Gynecology

## 2016-04-23 ENCOUNTER — Ambulatory Visit (INDEPENDENT_AMBULATORY_CARE_PROVIDER_SITE_OTHER): Payer: BC Managed Care – PPO | Admitting: Family Medicine

## 2016-04-23 ENCOUNTER — Encounter: Payer: Self-pay | Admitting: Family Medicine

## 2016-04-23 VITALS — BP 124/88 | HR 77 | Temp 99.0°F | Ht 65.0 in | Wt 141.0 lb

## 2016-04-23 DIAGNOSIS — R42 Dizziness and giddiness: Secondary | ICD-10-CM | POA: Diagnosis not present

## 2016-04-23 MED ORDER — ZOLPIDEM TARTRATE 10 MG PO TABS: 10.0000 mg | ORAL_TABLET | Freq: Every evening | ORAL | 2 refills | Status: DC | PRN

## 2016-04-23 MED ORDER — MECLIZINE HCL 25 MG PO TABS: 25.0000 mg | ORAL_TABLET | ORAL | 2 refills | Status: DC | PRN

## 2016-04-23 NOTE — Progress Notes (Signed)
Pre visit review using our clinic review tool, if applicable. No additional management support is needed unless otherwise documented below in the visit note. 

## 2016-04-23 NOTE — Patient Instructions (Signed)
WE NOW OFFER    [facility] FAST TR[facility]!!!  SAME DAY Appointments for ACUTE CARE  Such as: Sprains, Injuries, cuts, abrasions, rashes, muscle pain, joint pain, back pain Colds, flu, sore throats, headache, allergies, cough, fever  Ear pain, sinus and eye infections Abdominal pain, nausea, vomiting, diarrhea, upset stomach Animal/insect bites  3 Easy Ways to Schedule: Walk-In Scheduling Call in scheduling Mychart Sign-up: [email or web address]         

## 2016-04-23 NOTE — Progress Notes (Signed)
   Subjective:    Patient ID: Elizabeth [name], female    DOB: 09/21/1965, 51 y.o.   MRN: [id number]  HPI Here for 2 days of intermittent dizziness that make it feel like the room is spinning. This occurs when she looks up quickly or stand up quickly. It lasts a few minutes and then fades away. No headache or other neurologic deficits. No recent trauma.    Review of Systems  Constitutional: Negative.   HENT: Negative.   Respiratory: Negative.   Cardiovascular: Negative.   Neurological: Positive for dizziness. Negative for syncope, weakness, numbness and headaches.       Objective:   Physical Exam  Constitutional: She is oriented to person, place, and time. She appears well-developed and well-nourished. No distress.  HENT:  Head: Normocephalic and atraumatic.  Right Ear: External ear normal.  Left Ear: External ear normal.  Nose: Nose normal.  Mouth/Throat: Oropharynx is clear and moist.  Eyes: Conjunctivae and EOM are normal. Pupils are equal, round, and reactive to light.  Neck: Neck supple. No thyromegaly present.  Cardiovascular: Normal rate, regular rhythm, normal heart sounds and intact distal pulses.   Pulmonary/Chest: Effort normal and breath sounds normal.  Neurological: She is alert and oriented to person, place, and time. No cranial nerve deficit. Coordination normal.          Assessment & Plan:  Vertigo. Take Claritin daily and drink plenty of fluids. Add Meclizine prn.  Gershon CraneStephen Fry, MD

## 2016-06-08 ENCOUNTER — Encounter: Payer: Self-pay | Admitting: Obstetrics & Gynecology

## 2016-06-08 ENCOUNTER — Ambulatory Visit (INDEPENDENT_AMBULATORY_CARE_PROVIDER_SITE_OTHER): Payer: BC Managed Care – PPO | Admitting: Obstetrics & Gynecology

## 2016-06-08 VITALS — BP 140/80 | HR 84 | Temp 98.6°F | Resp 16 | Ht 65.0 in | Wt 138.0 lb

## 2016-06-08 DIAGNOSIS — L237 Allergic contact dermatitis due to plants, except food: Secondary | ICD-10-CM

## 2016-06-08 DIAGNOSIS — N9089 Other specified noninflammatory disorders of vulva and perineum: Secondary | ICD-10-CM | POA: Diagnosis not present

## 2016-06-08 MED ORDER — METHYLPREDNISOLONE 4 MG PO TBPK: ORAL_TABLET | ORAL | 0 refills | Status: DC

## 2016-06-08 NOTE — Progress Notes (Signed)
GYNECOLOGY  VISIT   HPI: 51 y.o. [id number] Divorced Caucasian female here for me to check a dark area on her vulva.  Pt noticed this this morning and went to the bathroom to look.  It is dark and purple, tender to the touch, and firm feeling to her.  It has been bleeding today as well.  She's never noticed this before.  Pt also reports significant poison ivy contact dermatitis that has been present for six days.  It is on both arms, her breasts, and thighs.  She knew she was allergic and saw some small plants and thought she should remove them.    GYNECOLOGIC HISTORY: No LMP recorded. Patient is not currently having periods (Reason: Oral contraceptives). Contraception: PMP  Patient Active Problem List   Diagnosis Date Noted  . Allergic rhinitis 08/13/2011  . PLANTAR FASCIITIS, RIGHT 08/19/2008  . UNEQUAL LEG LENGTH 08/19/2008  . 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 BLEPHARITIS, BILATERAL 02/24/2007  . GERD 10/14/2006    Past Medical History:  Diagnosis Date  . Allergy    sees Dr. Lucie LeatherKozlow  . GERD (gastroesophageal reflux disease)   . Hematuria    asymptomatic  . Plantar fasciitis   . Scoliosis    mild thoracolumnar-per Dr Lesia HausenKwiatowski note in [facility]    Past Surgical History:  Procedure Laterality Date  . TONSILLECTOMY    . UPPER GI ENDOSCOPY      MEDS:  Reviewed in EPIC and UTD  ALLERGIES: Protonix [pantoprazole sodium]  Family History  Problem Relation Age of Onset  . Heart disease Father   . Hypertension Brother   . Other Mother     AFIB  . Colon cancer Neg Hx     SH:  Divorced, non smoker  Review of Systems  Genitourinary:       Vulvar lesion  All other systems reviewed and are negative.   PHYSICAL EXAMINATION:    BP 140/80 (BP Location: Left Arm, Patient Position: Sitting, Cuff Size: Normal)   Pulse 84   Temp 98.6 F (37 C) (Oral)   Resp 16   Ht 5\' 5"  (1.651 m)   Wt 138 lb (62.6 kg)   BMI 22.96 kg/m     General appearance: alert,  cooperative and appears stated age Abdomen: soft, non-tender; bowel sounds normal; no masses,  no organomegaly Skin:  Erythematous, patchy rash on arms and thighs  Pelvic: External genitalia:  Right labia with bleeding and firm lesion that is about 2mm and bleeding.  It is tender to palpation              Urethra:  normal appearing urethra with no masses, tenderness or lesions              Bartholins and Skenes: normal                 Vagina: normal appearing vagina with normal color and discharge, no lesions               Anus:  no lesions  Pt is adamant about removal/biopsy today:  Consent obtained.  Areas cleansed with Betadine x 3.  1.0cc 1% Lidocaine instilled beneath lesion.  Using sterile pick-ups and scissors, area fully excised without difficulty.  Silver nitrate used for excellent hemostasis.  Pt tolerated procedure well.  No dressing applied.  Chaperone was present for exam.  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  Pathology will be called  to pt. Medrol 4mg  dose pak to pharmacy.  Directions provided.

## 2016-08-03 ENCOUNTER — Telehealth: Payer: Self-pay | Admitting: Family Medicine

## 2016-08-03 ENCOUNTER — Ambulatory Visit (INDEPENDENT_AMBULATORY_CARE_PROVIDER_SITE_OTHER): Payer: BC Managed Care – PPO | Admitting: Internal Medicine

## 2016-08-03 ENCOUNTER — Encounter: Payer: Self-pay | Admitting: Internal Medicine

## 2016-08-03 VITALS — BP 140/80 | HR 73 | Temp 98.8°F | Wt 142.0 lb

## 2016-08-03 DIAGNOSIS — W57XXXA Bitten or stung by nonvenomous insect and other nonvenomous arthropods, initial encounter: Secondary | ICD-10-CM

## 2016-08-03 DIAGNOSIS — S30861A Insect bite (nonvenomous) of abdominal wall, initial encounter: Secondary | ICD-10-CM | POA: Diagnosis not present

## 2016-08-03 DIAGNOSIS — R1909 Other intra-abdominal and pelvic swelling, mass and lump: Secondary | ICD-10-CM

## 2016-08-03 MED ORDER — DOXYCYCLINE HYCLATE 100 MG PO TABS: 100.0000 mg | ORAL_TABLET | Freq: Two times a day (BID) | ORAL | 0 refills | Status: DC

## 2016-08-03 NOTE — Progress Notes (Signed)
Chief Complaint  Patient presents with  . Acute Visit    tick bite     HPI: Elizabeth [name] 51 y.o.  SDA PCP NA  [place]  Had tick removed from umbilicus when she noted  Something  Yesterday tiny tick removed as best possible  Now weeping andirritatid and area of itching    And swelling below umbi  No bleed vfever    Hx tick on left had also  No fever other rashes   Father had hx of RMSF so  Aware .  No hx of umbi rash  ROS: See pertinent positives and negatives per HPI.  Past Medical History:  Diagnosis Date  . Allergy    sees Dr. Lucie LeatherKozlow  . GERD (gastroesophageal reflux disease)   . Hematuria    asymptomatic  . Plantar fasciitis   . Scoliosis    mild thoracolumnar-per Dr Lesia HausenKwiatowski note in [facility]    Family History  Problem Relation Age of Onset  . Heart disease Father   . Hypertension Brother   . Other Mother        AFIB  . Colon cancer Neg Hx     Social History   Social History  . Marital status: Divorced    Spouse name: N/A  . Number of children: 0  . Years of education: N/A   Occupational History  . [employer]   Social History Main Topics  . Smoking status: Never Smoker  . Smokeless tobacco: Never Used  . Alcohol use 0.0 oz/week     Comment: occ  . Drug use: No  . Sexual activity: Yes    Partners: Male    Birth control/ protection: Pill   Other Topics Concern  . None   Social History Narrative   No caffeine     Outpatient Medications Prior to Visit  Medication Sig Dispense Refill  . aspirin 81 MG tablet Take 81 mg by mouth daily.    Marland Kitchen. HYDROcodone-acetaminophen (NORCO) 5-325 MG tablet Take 1 tablet by mouth every 6 (six) hours as needed for moderate pain. 30 tablet 0  . loratadine (CLARITIN) 10 MG tablet Take 10 mg by mouth daily as needed. Reported on 07/27/2015    . meclizine (ANTIVERT) 25 MG tablet Take 1 tablet (25 mg total) by mouth every 4 (four) hours as needed for dizziness. 30 tablet 2  .  methylPREDNISolone (MEDROL DOSEPAK) 4 MG TBPK tablet Take as directed 21 tablet 0  . norethindrone-ethinyl estradiol (JUNEL FE 1/20) 1-20 MG-MCG tablet Take 1 tablet by mouth daily. 84 tablet 4  . valACYclovir (VALTREX) 1000 MG tablet Take 1 tablet (1,000 mg total) by mouth daily. (Patient taking differently: Take 1,000 mg by mouth as needed. ) 30 tablet 5  . cyclobenzaprine (FLEXERIL) 10 MG tablet Take 1 tablet (10 mg total) by mouth 3 (three) times daily as needed for muscle spasms. (Patient not taking: Reported on 08/03/2016) 60 tablet 2  . zolpidem (AMBIEN) 10 MG tablet Take 1 tablet (10 mg total) by mouth at bedtime as needed for sleep. 15 tablet 2   No facility-administered medications prior to visit.      EXAM:  BP 140/80 (BP Location: Left Arm, Patient Position: Sitting, Cuff Size: Normal)   Pulse 73   Temp 98.8 F (37.1 C) (Oral)   Wt 142 lb (64.4 kg)   BMI 23.63 kg/m   Body mass index is 23.63 kg/m.  GENERAL: vitals reviewed and listed above, alert, oriented,  appears well hydrated and in no acute distress HEENT: atraumatic, conjunctiva  clear, no obvious abnormalities on inspection of external nose and ears Abdomen:  Sof,t normal bowel sounds without hepatosplenomegaly, no guarding rebound or masses no CVA tenderness  Obvious eryhtema and some edema infa umbi area  About 3 cm  umbi has clear  Dc  But no pus and no fb seen with mag and cotton tip but  Cannot r/o  No abd pain  On palpation  MS: moves all extremities without noticeable focal  abnormality PSYCH: pleasant and cooperative, no obvious depression or anxiety  ASSESSMENT AND PLAN:  Discussed the following assessment and plan:  Tick bite, initial encounter  Umbilical swelling - local rx vs infection see text    Expectant management.  Can add antibiotic  Because of unusual presentational nd amount of redness  Swelling  .  Only gentle manipulation.   Risk benefit of medication discussed.  cautions  Gi sun sensitivity  etc  -Patient advised to return or notify health care team  if symptoms worsen ,persist or new concerns arise.  Patient Instructions  This is proabably  A large local reaction to tick bit but because of the swelling and weeping and unusal area   Treating for infection  Locally   Gentle local care.    Fu if alarming sx fever  Other rash pain etc .      Tick Bite Information, Adult Ticks are insects that draw blood for food. Most ticks live in shrubs and grassy areas. They climb onto people and animals that brush against the leaves and grasses that they rest on. Then they bite, attaching themselves to the skin. Most ticks are harmless, but some ticks carry germs that can spread to a person through a bite and cause a disease. To reduce your risk of getting a disease from a tick bite, it is important to take steps to prevent tick bites. It is also important to check for ticks after being outdoors. If you find that a tick has attached to you, watch for symptoms of disease. How can I prevent tick bites? Take these steps to help prevent tick bites when you are outdoors in an area where ticks are found:  Use insect repellent that has DEET (20% or higher), picaridin, or IR3535 in it. Use it on: ? Skin that is showing. ? The top of your boots. ? Your pant legs. ? Your sleeve cuffs.  For repellent products that contain permethrin, follow product instructions. Use these products on: ? Clothing. ? Gear. ? Boots. ? Tents.  Wear protective clothing. Long sleeves and long pants offer the best protection from ticks.  Wear light-colored clothing so you can see ticks more easily.  Tuck your pant legs into your socks.  If you go walking on a trail, stay in the middle of the trail so your skin, hair, and clothing do not touch the bushes.  Avoid walking through areas with long grass.  Check for ticks on your clothing, hair, and skin often while you are outside, and check again before you go inside.  Make sure to check the places that ticks attach themselves most often. These places include the scalp, neck, armpits, waist, groin, and joint areas. Ticks that carry a disease called Lyme disease have to be attached to the skin for 24-48 hours. Checking for ticks every day will lessen your risk of this and other diseases.  When you come indoors, wash your clothes and take a  shower or a bath right away. Dry your clothes in a dryer on high heat for at least 60 minutes. This will kill any ticks in your clothes.  What is the proper way to remove a tick? If you find a tick on your body, remove it as soon as possible. Removing a tick sooner rather than later can prevent germs from passing from the tick to your body. To remove a tick that is crawling on your skin but has not bitten:  Go outdoors and brush the tick off.  Remove the tick with tape or a lint roller.  To remove a tick that is attached to your skin:  Wash your hands.  If you have latex gloves, put them on.  Use tweezers, curved forceps, or a tick-removal tool to gently grasp the tick as close to your skin and the tick's head as possible.  Gently pull with steady, upward pressure until the tick lets go. When removing the tick: ? Take care to keep the tick's head attached to its body. ? Do not twist or jerk the tick. This can make the tick's head or mouth break off. ? Do not squeeze or crush the tick's body. This could force disease-carrying fluids from the tick into your body.  Do not try to remove a tick with heat, alcohol, petroleum jelly, or fingernail polish. Using these methods can cause the tick to salivate and regurgitate into your bloodstream, increasing your risk of getting a disease. What should I do after removing a tick?  Clean the bite area with soap and water, rubbing alcohol, or an iodine scrub.  If an antiseptic cream or ointment is available, apply a small amount to the bite site.  Wash and disinfect any  instruments that you used to remove the tick. How should I dispose of a tick? To dispose of a live tick, use one of these methods:  Place it in rubbing alcohol.  Place it in a sealed bag or container.  Wrap it tightly in tape.  Flush it down the toilet.  Contact a health care provider if:  You have symptoms of a disease after a tick bite. Symptoms of a tick-borne disease can occur from moments after the tick bites to up to 30 days after a tick is removed. Symptoms include: ? Muscle, joint, or bone pain. ? Difficulty walking or moving your legs. ? Numbness in the legs. ? Paralysis. ? Red rash around the tick bite area that is shaped like a target or a "bull's-eye." ? Redness and swelling in the area of the tick bite. ? Fever. ? Repeated vomiting. ? Diarrhea. ? Weight loss. ? Tender, swollen lymph glands. ? Shortness of breath. ? Cough. ? Pain in the abdomen. ? Headache. ? Abnormal tiredness. ? A change in your level of consciousness. ? Confusion. Get help right away if:  You are not able to remove a tick.  A part of a tick breaks off and gets stuck in your skin.  Your symptoms get worse. Summary  Ticks may carry germs that can spread to a person through a bite and cause disease.  Wear protective clothing and use insect repellent to prevent tick bites. Follow product instructions.  If you find a tick on your body, remove it as soon as possible. If the tick is attached, do not try to remove with heat, alcohol, petroleum jelly, or fingernail polish.  Remove the attached tick using tweezers, curved forceps, or a tick-removal tool. Gently pull with  steady, upward pressure until the tick lets go. Do not twist or jerk the tick. Do not squeeze or crush the tick's body.  If you have symptoms after being bitten by a tick, contact a health care provider. This information is not intended to replace advice given to you by your health care provider. Make sure you discuss any  questions you have with your health care provider. Document Released: 01/20/2000 Document Revised: 11/04/2015 Document Reviewed: 11/04/2015 Elsevier Interactive Patient Education  2018 [employer].     [place]. Tanayia Wahlquist M.D.

## 2016-08-03 NOTE — Telephone Encounter (Signed)
Spoke to patient who is very concerned about tick bite, patient states that she isn't even sure if she removed the entire tick; patient reports red rash around navel and clear drainage from navel area. Patient coming in for appointment at 3pm with Dr. Fabian SharpPanosh

## 2016-08-03 NOTE — Patient Instructions (Addendum)
This is proabably  A large local reaction to tick bit but because of the swelling and weeping and unusal area   Treating for infection  Locally   Gentle local care.    Fu if alarming sx fever  Other rash pain etc .      Tick Bite Information, Adult Ticks are insects that draw blood for food. Most ticks live in shrubs and grassy areas. They climb onto people and animals that brush against the leaves and grasses that they rest on. Then they bite, attaching themselves to the skin. Most ticks are harmless, but some ticks carry germs that can spread to a person through a bite and cause a disease. To reduce your risk of getting a disease from a tick bite, it is important to take steps to prevent tick bites. It is also important to check for ticks after being outdoors. If you find that a tick has attached to you, watch for symptoms of disease. How can I prevent tick bites? Take these steps to help prevent tick bites when you are outdoors in an area where ticks are found:  Use insect repellent that has DEET (20% or higher), picaridin, or IR3535 in it. Use it on: ? Skin that is showing. ? The top of your boots. ? Your pant legs. ? Your sleeve cuffs.  For repellent products that contain permethrin, follow product instructions. Use these products on: ? Clothing. ? Gear. ? Boots. ? Tents.  Wear protective clothing. Long sleeves and long pants offer the best protection from ticks.  Wear light-colored clothing so you can see ticks more easily.  Tuck your pant legs into your socks.  If you go walking on a trail, stay in the middle of the trail so your skin, hair, and clothing do not touch the bushes.  Avoid walking through areas with long grass.  Check for ticks on your clothing, hair, and skin often while you are outside, and check again before you go inside. Make sure to check the places that ticks attach themselves most often. These places include the scalp, neck, armpits, waist, groin, and  joint areas. Ticks that carry a disease called Lyme disease have to be attached to the skin for 24-48 hours. Checking for ticks every day will lessen your risk of this and other diseases.  When you come indoors, wash your clothes and take a shower or a bath right away. Dry your clothes in a dryer on high heat for at least 60 minutes. This will kill any ticks in your clothes.  What is the proper way to remove a tick? If you find a tick on your body, remove it as soon as possible. Removing a tick sooner rather than later can prevent germs from passing from the tick to your body. To remove a tick that is crawling on your skin but has not bitten:  Go outdoors and brush the tick off.  Remove the tick with tape or a lint roller.  To remove a tick that is attached to your skin:  Wash your hands.  If you have latex gloves, put them on.  Use tweezers, curved forceps, or a tick-removal tool to gently grasp the tick as close to your skin and the tick's head as possible.  Gently pull with steady, upward pressure until the tick lets go. When removing the tick: ? Take care to keep the tick's head attached to its body. ? Do not twist or jerk the tick. This can make the  tick's head or mouth break off. ? Do not squeeze or crush the tick's body. This could force disease-carrying fluids from the tick into your body.  Do not try to remove a tick with heat, alcohol, petroleum jelly, or fingernail polish. Using these methods can cause the tick to salivate and regurgitate into your bloodstream, increasing your risk of getting a disease. What should I do after removing a tick?  Clean the bite area with soap and water, rubbing alcohol, or an iodine scrub.  If an antiseptic cream or ointment is available, apply a small amount to the bite site.  Wash and disinfect any instruments that you used to remove the tick. How should I dispose of a tick? To dispose of a live tick, use one of these methods:  Place it  in rubbing alcohol.  Place it in a sealed bag or container.  Wrap it tightly in tape.  Flush it down the toilet.  Contact a health care provider if:  You have symptoms of a disease after a tick bite. Symptoms of a tick-borne disease can occur from moments after the tick bites to up to 30 days after a tick is removed. Symptoms include: ? Muscle, joint, or bone pain. ? Difficulty walking or moving your legs. ? Numbness in the legs. ? Paralysis. ? Red rash around the tick bite area that is shaped like a target or a "bull's-eye." ? Redness and swelling in the area of the tick bite. ? Fever. ? Repeated vomiting. ? Diarrhea. ? Weight loss. ? Tender, swollen lymph glands. ? Shortness of breath. ? Cough. ? Pain in the abdomen. ? Headache. ? Abnormal tiredness. ? A change in your level of consciousness. ? Confusion. Get help right away if:  You are not able to remove a tick.  A part of a tick breaks off and gets stuck in your skin.  Your symptoms get worse. Summary  Ticks may carry germs that can spread to a person through a bite and cause disease.  Wear protective clothing and use insect repellent to prevent tick bites. Follow product instructions.  If you find a tick on your body, remove it as soon as possible. If the tick is attached, do not try to remove with heat, alcohol, petroleum jelly, or fingernail polish.  Remove the attached tick using tweezers, curved forceps, or a tick-removal tool. Gently pull with steady, upward pressure until the tick lets go. Do not twist or jerk the tick. Do not squeeze or crush the tick's body.  If you have symptoms after being bitten by a tick, contact a health care provider. This information is not intended to replace advice given to you by your health care provider. Make sure you discuss any questions you have with your health care provider. Document Released: 01/20/2000 Document Revised: 11/04/2015 Document Reviewed: 11/04/2015 Elsevier  Interactive Patient Education  [employer]2018 Elsevier Inc[employer]

## 2016-08-03 NOTE — Telephone Encounter (Signed)
Patient Name: Elizabeth HearingSIDONIE [name] DOB: 06/20/1965 Initial Comment Caller says , belly button was bothering her yesterday, she noticed a tick in her naval. the head left parts in her. She has redness and still has some oozing clear fluid. Nurse Assessment Nurse: Katrina Stackranmore, RN, Dahlia ClientHannah Date/Time (Eastern Time): 08/03/2016 12:35:27 PM Confirm and document reason for call. If symptomatic, describe symptoms. ---Caller states she noticed a tick in her belly button yesterday and it was a tick. The area is oozing and is red around the tick bite. She thinks she aggravated it and did get the tick completely removed. Does the patient have any new or worsening symptoms? ---Yes Will a triage be completed? ---Yes Related visit to physician within the last 2 weeks? ---No Does the PT have any chronic conditions? (i.e. diabetes, asthma, etc.) ---No Is the patient pregnant or possibly pregnant? (Ask all females between the ages of 7312-55) ---No Is this a behavioral health or substance abuse call? ---No Guidelines Guideline Title Affirmed Question Affirmed Notes Tick Bite [1] Red or very tender (to touch) area AND [2] started over 24 hours after the bite Final Disposition User See Physician within 24 Hours Cranmore, RN, Dahlia ClientHannah Comments Appt scheduled for today at 3pm with Dr. Fabian SharpPanosh Referrals REFERRED TO PCP OFFICE Disagree/Comply: Comply

## 2016-09-21 ENCOUNTER — Telehealth: Payer: Self-pay | Admitting: Family Medicine

## 2016-09-21 MED ORDER — TRIAMCINOLONE ACETONIDE 0.1 % EX CREA: 1.0000 "application " | TOPICAL_CREAM | Freq: Three times a day (TID) | CUTANEOUS | 2 refills | Status: DC | PRN

## 2016-09-21 NOTE — Telephone Encounter (Signed)
Call in Triamcinolone 0.1 % cream to apply tid prn, 45 grams with 2 rf

## 2016-09-21 NOTE — Telephone Encounter (Signed)
I sent script e-scribe to [facility] and left a voice message for pt with this information.

## 2016-09-21 NOTE — Telephone Encounter (Signed)
Pt has poison ivy on face, arm etc. [place]

## 2016-10-24 ENCOUNTER — Ambulatory Visit (INDEPENDENT_AMBULATORY_CARE_PROVIDER_SITE_OTHER): Payer: BC Managed Care – PPO | Admitting: Family Medicine

## 2016-10-24 ENCOUNTER — Encounter: Payer: Self-pay | Admitting: Family Medicine

## 2016-10-24 VITALS — BP 133/88 | HR 88 | Temp 98.6°F | Ht 65.0 in | Wt 141.0 lb

## 2016-10-24 DIAGNOSIS — M7741 Metatarsalgia, right foot: Secondary | ICD-10-CM | POA: Diagnosis not present

## 2016-10-24 MED ORDER — DICLOFENAC SODIUM 75 MG PO TBEC: 75.0000 mg | DELAYED_RELEASE_TABLET | Freq: Two times a day (BID) | ORAL | 2 refills | Status: DC

## 2016-10-24 NOTE — Patient Instructions (Signed)
WE NOW OFFER   [facility] FAST TRACK!!!  SAME DAY Appointments for ACUTE CARE  Such as: Sprains, Injuries, cuts, abrasions, rashes, muscle pain, joint pain, back pain Colds, flu, sore throats, headache, allergies, cough, fever  Ear pain, sinus and eye infections Abdominal pain, nausea, vomiting, diarrhea, upset stomach Animal/insect bites  3 Easy Ways to Schedule: Walk-In Scheduling Call in scheduling Mychart Sign-up: [email or web address]         

## 2016-10-24 NOTE — Progress Notes (Signed)
   Subjective:    Patient ID: Elizabeth [name], female    DOB: Mar 12, 1965, 52 y.o.   MRN: [id number]  HPI Here for 2 months of pain in the right lateral foot. No hx of trauma. Advil helps a little.    Review of Systems  Constitutional: Negative.   Respiratory: Negative.   Cardiovascular: Negative.   Musculoskeletal: Positive for arthralgias.       Objective:   Physical Exam  Constitutional: She appears well-developed and well-nourished.  Cardiovascular: Normal rate, regular rhythm, normal heart sounds and intact distal pulses.   Pulmonary/Chest: Effort normal and breath sounds normal. No respiratory distress. She has no wheezes. She has no rales.  Musculoskeletal:  The right foot has some swelling and tenderness around the proximal head of the 5th metatarsal bone. No warmth or erythema           Assessment & Plan:  Metatarsalgia. Wear supportive shoes. Try Diclofenac bid for a few weeks and recheck prn.  Gershon Crane, MD

## 2017-02-15 ENCOUNTER — Other Ambulatory Visit: Payer: Self-pay | Admitting: Obstetrics & Gynecology

## 2017-02-15 NOTE — Telephone Encounter (Signed)
Medication refill request: JUNEL FE and Valtrex Last AEX:  01/03/16 SM Next AEX: 04/12/17  Last MMG (if hormonal medication request): 12/20/15 BIRADS 1 negative/density a Refill authorized: Please advise on refills

## 2017-02-28 ENCOUNTER — Encounter: Payer: Self-pay | Admitting: Family Medicine

## 2017-02-28 ENCOUNTER — Ambulatory Visit: Payer: BC Managed Care – PPO | Admitting: Family Medicine

## 2017-02-28 VITALS — BP 124/80 | HR 68 | Temp 98.1°F | Wt 145.8 lb

## 2017-02-28 DIAGNOSIS — J018 Other acute sinusitis: Secondary | ICD-10-CM

## 2017-02-28 MED ORDER — CLARITHROMYCIN 500 MG PO TABS: 500.0000 mg | ORAL_TABLET | Freq: Two times a day (BID) | ORAL | 0 refills | Status: DC

## 2017-02-28 MED ORDER — HYDROCODONE-HOMATROPINE 5-1.5 MG/5ML PO SYRP: 5.0000 mL | ORAL_SOLUTION | ORAL | 0 refills | Status: DC | PRN

## 2017-02-28 NOTE — Progress Notes (Signed)
   Subjective:    Patient ID: Elizabeth [name], female    DOB: 07/07/1965, 52 y.o.   MRN: [id number]  HPI Here for 4 days of sinus congestion, blowing yellow mucus from the nose, and a dry cough. No fever.    Review of Systems  Constitutional: Negative.   HENT: Positive for congestion, postnasal drip and sinus pressure. Negative for sinus pain and sore throat.   Eyes: Negative.   Respiratory: Positive for cough.        Objective:   Physical Exam  Constitutional: She appears well-developed and well-nourished.  HENT:  Right Ear: External ear normal.  Left Ear: External ear normal.  Nose: Nose normal.  Mouth/Throat: Oropharynx is clear and moist.  Eyes: Conjunctivae are normal.  Neck: No thyromegaly present.  Pulmonary/Chest: Effort normal and breath sounds normal. No respiratory distress. She has no wheezes. She has no rales.  Lymphadenopathy:    She has no cervical adenopathy.          Assessment & Plan:  Sinusitis, treat with Biaxin. Gershon CraneStephen Lillyana Majette, MD

## 2017-03-07 ENCOUNTER — Telehealth: Payer: Self-pay | Admitting: Family Medicine

## 2017-03-07 NOTE — Telephone Encounter (Signed)
Copied from CRM [id number]. Topic: Quick Communication - See Telephone Encounter >> Mar 07, 2017  1:44 PM Landry MellowFoltz, Melissa J wrote: CRM for notification. See Telephone encounter for:   03/07/17. Pt is still has congestion, she feels a little better, but it is not going away.  She does not think the abx is working.  [phone] [facility]

## 2017-03-08 MED ORDER — LEVOFLOXACIN 500 MG PO TABS: 500.0000 mg | ORAL_TABLET | Freq: Every day | ORAL | 0 refills | Status: DC

## 2017-03-08 NOTE — Telephone Encounter (Signed)
Sent to PCP would you like to send in another abx or would you want her to be seen for another OV?

## 2017-03-08 NOTE — Telephone Encounter (Signed)
Call in Levaquin 500 mg daily for 10 days  

## 2017-03-18 ENCOUNTER — Other Ambulatory Visit: Payer: Self-pay | Admitting: Family Medicine

## 2017-03-19 NOTE — Telephone Encounter (Signed)
This was sent in  

## 2017-03-19 NOTE — Telephone Encounter (Signed)
Last OV 02/28/2017   Last refilled 02/28/2017 disp 240 ml with no refills sent to PCP for approval

## 2017-04-12 ENCOUNTER — Encounter: Payer: Self-pay | Admitting: Obstetrics & Gynecology

## 2017-04-12 ENCOUNTER — Ambulatory Visit: Payer: BC Managed Care – PPO | Admitting: Obstetrics & Gynecology

## 2017-04-12 ENCOUNTER — Other Ambulatory Visit: Payer: Self-pay | Admitting: Obstetrics & Gynecology

## 2017-04-12 ENCOUNTER — Other Ambulatory Visit: Payer: Self-pay

## 2017-04-12 VITALS — BP 120/80 | HR 80 | Resp 16 | Ht 64.0 in | Wt 144.0 lb

## 2017-04-12 DIAGNOSIS — Z01419 Encounter for gynecological examination (general) (routine) without abnormal findings: Secondary | ICD-10-CM | POA: Diagnosis not present

## 2017-04-12 DIAGNOSIS — Z1231 Encounter for screening mammogram for malignant neoplasm of breast: Secondary | ICD-10-CM

## 2017-04-12 MED ORDER — NORETHIN ACE-ETH ESTRAD-FE 1-20 MG-MCG PO TABS: 1.0000 | ORAL_TABLET | Freq: Every day | ORAL | 4 refills | Status: DC

## 2017-04-12 NOTE — Progress Notes (Signed)
52 y.o. G0P0000 DivorcedCaucasianF here for annual exam.  Doing well.  Boyfriend had hip replacement and then two day staged back surgery in January.  She's been his primary caregiver.    No LMP recorded. Patient is not currently having periods (Reason: Oral contraceptives).          Sexually active: Yes.    The current method of family planning is OCP (estrogen/progesterone).    Exercising: Yes.    walking Smoker:  no  Health Maintenance: Pap:  01/03/16 Neg. HR HPV:neg   09/09/13 neg  History of abnormal Pap:  no MMG:  12/20/15 BIRADS1:neg.  Aware this is due.   Colonoscopy:  01/15/14 Normal. F/u 10 years  BMD:  Never TDaP:  2016  Pneumonia vaccine(s):  n/a Shingrix:   No Hep C testing: 06/10/15 Neg  Screening Labs:none today.   reports that  has never smoked. she has never used smokeless tobacco. She reports that she drinks alcohol. She reports that she does not use drugs.  Past Medical History:  Diagnosis Date  . Allergy    sees Dr. Lucie LeatherKozlow  . GERD (gastroesophageal reflux disease)   . Hematuria    asymptomatic  . Plantar fasciitis   . Scoliosis    mild thoracolumnar-per Dr Lesia HausenKwiatowski note in [facility]    Past Surgical History:  Procedure Laterality Date  . TONSILLECTOMY    . UPPER GI ENDOSCOPY      Current Outpatient Medications  Medication Sig Dispense Refill  . JUNEL FE 1/20 1-20 MG-MCG tablet TAKE 1 TABLET ONCE DAILY. 84 tablet 0  . Melatonin 2.5 MG CAPS Take by mouth at bedtime.    . valACYclovir (VALTREX) 1000 MG tablet TAKE 1 TABLET BY MOUTH DAILY. 30 tablet 1  . zolpidem (AMBIEN) 10 MG tablet Take 1 tablet (10 mg total) by mouth at bedtime as needed for sleep. 15 tablet 2   No current facility-administered medications for this visit.     Family History  Problem Relation Age of Onset  . Heart disease Father   . Hypertension Brother   . Other Mother        AFIB  . Colon cancer Neg Hx     ROS:  Pertinent items are noted in HPI.  Otherwise, a  comprehensive ROS was negative.  Exam:   BP 120/80 (BP Location: Right Arm, Patient Position: Sitting, Cuff Size: Normal)   Pulse 80   Resp 16   Ht 5\' 4"  (1.626 m)   Wt 144 lb (65.3 kg)   BMI 24.72 kg/m    Height: 5\' 4"  (162.6 cm)  Ht Readings from Last 3 Encounters:  04/12/17 5\' 4"  (1.626 m)  10/24/16 5\' 5"  (1.651 m)  06/08/16 5\' 5"  (1.651 m)    General appearance: alert, cooperative and appears stated age Head: Normocephalic, without obvious abnormality, atraumatic Neck: no adenopathy, supple, symmetrical, trachea midline and thyroid normal to inspection and palpation Lungs: clear to auscultation bilaterally Breasts: normal appearance, no masses or tenderness Heart: regular rate and rhythm Abdomen: soft, non-tender; bowel sounds normal; no masses,  no organomegaly Extremities: extremities normal, atraumatic, no cyanosis or edema Skin: Skin color, texture, turgor normal. No rashes or lesions Lymph nodes: Cervical, supraclavicular, and axillary nodes normal. No abnormal inguinal nodes palpated Neurologic: Grossly normal   Pelvic: External genitalia:  no lesions              Urethra:  normal appearing urethra with no masses, tenderness or lesions  Bartholins and Skenes: normal                 Vagina: normal appearing vagina with normal color and discharge, no lesions              Cervix: no lesions              Pap taken: No. Bimanual Exam:  Uterus:  normal size, contour, position, consistency, mobility, non-tender              Adnexa: normal adnexa and no mass, fullness, tenderness               Rectovaginal: Confirms               Anus:  normal sphincter tone, no lesions  Chaperone was present for exam.  A:  Well Woman with normal exam On OCPs for contraception, declines FSH today.  Aware will need to stop OCPs and transition to POPs after she turns 53.  Voices understanding. H/O HSV 2 H/O microscopic hematuria with urology evaluation per pt (do not have  records)  P:   Mammogram will be scheduled for pt.  Grade A breast density pap smear not indicated.  Neg pap and neg HR HPV 11/17. No lab work done today.  Will do fasting last next year D/w pt shingrix vaccination. RF for Junel 1/20 daily.  #3 month supply/3RF.  Risks reviewed including DVT, PE, stroke No valtrex rx needed now.  Will call when she does need. return annually or prn

## 2017-04-15 ENCOUNTER — Ambulatory Visit
Admission: RE | Admit: 2017-04-15 | Discharge: 2017-04-15 | Disposition: A | Discharge status: Home / Self Care | Payer: BC Managed Care – PPO | Source: Ambulatory Visit | Attending: Obstetrics & Gynecology | Admitting: Obstetrics & Gynecology

## 2017-04-15 DIAGNOSIS — Z1231 Encounter for screening mammogram for malignant neoplasm of breast: Secondary | ICD-10-CM

## 2017-04-24 ENCOUNTER — Other Ambulatory Visit: Payer: Self-pay | Admitting: Family Medicine

## 2017-04-24 NOTE — Telephone Encounter (Signed)
Last OV 02/28/2017   Last refilled 04/23/2016 disp 15 with 2 refills   Sent to PCP for approval

## 2017-04-24 NOTE — Telephone Encounter (Signed)
Call in #15 with 5 rf 

## 2017-05-27 ENCOUNTER — Ambulatory Visit: Payer: Self-pay

## 2017-05-27 NOTE — Telephone Encounter (Signed)
Patient called in with c/o "ankle swelling from injury." She says "I was mowing the yard and a stick hit the inside of my left ankle. It immediately started swelling to the size of a golf ball. I put ice on it and now it is a flat swollen and purple/red in color. I haven't tried to walk on it since I've iced it, but I am able to move my foot around without much pain. The pain is about 2-3. I don't have fever, leg pain, chest pain, difficulty breathing." According to protocol, home care advice. I advised to continue to ice for about 15-20 minutes at a time about every few hours to pain and swelling. Advise to take ibuprofen (swelling) and tylenol for pain. Advised if the ankle becomes more swollen, painful, leg swelling and/or pain, to call the office back tomorrow for an appointment or go to the UC or ED, she verbalized understanding.  Reason for Disposition . [1] Area of swelling AND [2] itchy  Answer Assessment - Initial Assessment Questions 1. LOCATION: "Which joint is swollen?"     Left 2. ONSET: "When did the swelling start?"     Today, but now now has gone down 3. SIZE: "How large is the swelling?"     Was a golf ball size, but went down after ice 4. PAIN: "Is there any pain?" If so, ask: "How bad is it?" (Scale 1-10; or mild, moderate, severe)     2-3 5. CAUSE: "What do you think caused the swollen joint?"     Injury 6. OTHER SYMPTOMS: "Do you have any other symptoms?" (e.g., fever, chest pain, difficulty breathing, calf pain)     No 7. PREGNANCY: "Is there any chance you are pregnant?" "When was your last menstrual period?"     No  Protocols used: ANKLE SWELLING-A-AH

## 2018-02-12 ENCOUNTER — Encounter

## 2018-02-12 ENCOUNTER — Ambulatory Visit: Payer: BC Managed Care – PPO | Admitting: Family Medicine

## 2018-02-12 ENCOUNTER — Encounter: Payer: Self-pay | Admitting: Family Medicine

## 2018-02-12 VITALS — BP 124/80 | HR 75 | Temp 98.2°F | Wt 151.4 lb

## 2018-02-12 DIAGNOSIS — J019 Acute sinusitis, unspecified: Secondary | ICD-10-CM | POA: Diagnosis not present

## 2018-02-12 MED ORDER — LEVOFLOXACIN 500 MG PO TABS: 500.0000 mg | ORAL_TABLET | Freq: Every day | ORAL | 0 refills | Status: AC

## 2018-02-12 NOTE — Progress Notes (Signed)
   Subjective:    Patient ID: Elizabeth [name], female    DOB: 07/21/1965, 53 y.o.   MRN: [id number]  HPI Here for 5 days of sinus pressure, PND, ST, and a dry cough. No fever. On Mucinex.    Review of Systems  Constitutional: Negative.   HENT: Positive for congestion, postnasal drip, sinus pressure, sinus pain and sore throat.   Eyes: Negative.   Respiratory: Positive for cough.        Objective:   Physical Exam Constitutional:      Appearance: Normal appearance. She is well-developed.  HENT:     Right Ear: Tympanic membrane and ear canal normal.     Left Ear: Tympanic membrane and ear canal normal.     Nose: Nose normal.     Mouth/Throat:     Pharynx: Oropharynx is clear.  Eyes:     Conjunctiva/sclera: Conjunctivae normal.  Pulmonary:     Effort: Pulmonary effort is normal. No respiratory distress.     Breath sounds: Normal breath sounds. No stridor. No wheezing, rhonchi or rales.  Lymphadenopathy:     Cervical: No cervical adenopathy.  Neurological:     Mental Status: She is alert.           Assessment & Plan:  Sinusitis, treat with Levaquin. Gershon CraneStephen Fry, MD

## 2018-02-14 ENCOUNTER — Ambulatory Visit: Payer: Self-pay

## 2018-02-14 MED ORDER — HYDROCODONE-HOMATROPINE 5-1.5 MG/5ML PO SYRP: 5.0000 mL | ORAL_SOLUTION | ORAL | 0 refills | Status: DC | PRN

## 2018-02-14 NOTE — Addendum Note (Signed)
Addended by: Gershon Crane A on: 02/14/2018 04:43 PM   Modules accepted: Orders

## 2018-02-14 NOTE — Telephone Encounter (Signed)
Called and spoke with pt and she stated that the nasal congestion that she is blowing out is still yellow. She stated that sometimes Dr. [name] has to change her abx.    Pt would like to have a cough syrup called into her pharmacy.  Dr. [name] please advise. Thanks

## 2018-02-14 NOTE — Telephone Encounter (Signed)
The Levaquin she is taking is the strongest one we could use. She likely has a viral infection anyway, but I suggest she stay on the Levaquin. We can send in a rx for cough syrup if she wishes

## 2018-02-14 NOTE — Telephone Encounter (Signed)
I sent in the order for cough syrup

## 2018-02-14 NOTE — Telephone Encounter (Signed)
Incoming call from Patient who states that,  since she has been in the office on Wednesday.   Her Sx have become Worse.  She now has a cough.  She is blowing out yellow and green.  Patient feels like it is going down in her chest. Patient desires a new Rx be called in.  Patient awaits a return call from Provider r/t worsening Sx.     Reason for Disposition . [1] Taking antibiotics > 24 hours AND [2] symptoms WORSE  Answer Assessment - Initial Assessment Questions 1. SYMPTOMS: "What symptoms are you most concerned about?" "Is it better, the same, or worse compared to when you saw the doctor?"     Congestion " packed , full 2. BREATHING DIFFICULTY: "Are you having any difficulty breathing?" If so, ask "How bad is it?"  (e.g., none, mild, moderate, severe)   - MILD: No SOB at rest, mild SOB with walking, speaks normally in sentences, can lay down, no retractions, pulse < 100.    - MODERATE: SOB at rest, SOB with minimal exertion and prefers to sit, cannot lie down flat, speaks in phrases, mild retractions, audible wheezing, pulse 100-120.    - SEVERE: Very SOB at rest, speaks in single words, struggling to breathe, sitting hunched forward, retractions, pulse > 120       Can breathe fine . Just not thru nose all the time 3. FEVER: "Do you have a fever?" If so, ask: "What is your temperature, how was it measured, and when did it start?"     I dont thinks so  4. SPUTUM: "Describe the color of your sputum" (clear, white, yellow, green, blood-tinged)     Yellow and green 5. DIAGNOSIS CONFIRMATION: "When was the pneumonia diagnosed?" "By whom?"    no 6. ANTIBIOTIC: "Are you taking an antibiotic?"  If so, "Which one?" "When was it started?"    Will look in chart 7. OTHER TREATMENT: "Are you receiving any other treatment for the pneumonia?" (e.g., albuterol nebulizer, oxygen) If so, ask, "How often?" and "Do they help?"     Turns into sinus infection 8. HOSPITAL ADMISSION: "Were you hospitalized for this  pneumonia?" If so, ask "When were you discharged home from the hospital?"    na  Answer Assessment - Initial Assessment Questions 1. INFECTION: "What infection is the antibiotic being given for?"     ? Sinus infection 2. 3. DURATION: "When was the antibiotic started?"     Wednesday  4. MAIN CONCERN OR SYMPTOM:  "What is your main concern right now?"      Sx have gotten worse, now coughing, now blowing out yellow and green mucous 5. BETTER-SAME-WORSE: "Are you getting better, staying the same, or getting worse compared to when you first started the antibiotics?" If getting worse, ask: "In what way?"      Getting worse6. FEVER: "Do you have a fever?" If so, ask: "What is your temperature, how was it measured, and when did it start?"     Denies fever.   7. SYMPTOMS: "Are there any other symptoms you're concerned  Protocols used: INFECTION ON ANTIBIOTIC FOLLOW-UP CALL-A-AH, PNEUMONIA ON ANTIBIOTIC FOLLOW-UP CALL-A-AH

## 2018-02-14 NOTE — Telephone Encounter (Signed)
Dr. Fry please advise. Thanks  

## 2018-02-21 ENCOUNTER — Telehealth: Payer: Self-pay | Admitting: Family Medicine

## 2018-02-21 MED ORDER — DOXYCYCLINE HYCLATE 100 MG PO TABS: 100.0000 mg | ORAL_TABLET | Freq: Two times a day (BID) | ORAL | 0 refills | Status: DC

## 2018-02-21 NOTE — Telephone Encounter (Signed)
Dr. Fry please advise. Thanks  

## 2018-02-21 NOTE — Telephone Encounter (Signed)
Change to Doxycycline 100 mg bid. Call in a 10 day supply

## 2018-02-21 NOTE — Telephone Encounter (Signed)
Copied from CRM [id number]. Topic: Quick Communication - See Telephone Encounter >> Feb 21, 2018 10:31 AM Lorrine Kin, NT wrote: CRM for notification. See Telephone encounter for: 02/21/18. Patient calling and states that she saw Dr [name] 10 days ago and she does not think this is the right antibiotic for her. States that her congestion has not improved. She feels a little better, but is still very congested. Would like to know what he suggests.

## 2018-02-21 NOTE — Telephone Encounter (Signed)
Called and spoke with pt and she is aware of rx for the doxy that has been sent to her pharmacy.

## 2018-03-21 ENCOUNTER — Ambulatory Visit: Payer: BC Managed Care – PPO | Admitting: Family Medicine

## 2018-03-21 ENCOUNTER — Ambulatory Visit (INDEPENDENT_AMBULATORY_CARE_PROVIDER_SITE_OTHER): Payer: BC Managed Care – PPO

## 2018-03-21 ENCOUNTER — Encounter: Payer: Self-pay | Admitting: Family Medicine

## 2018-03-21 VITALS — BP 106/78 | HR 76 | Temp 98.5°F | Wt 153.4 lb

## 2018-03-21 DIAGNOSIS — R05 Cough: Secondary | ICD-10-CM

## 2018-03-21 DIAGNOSIS — J019 Acute sinusitis, unspecified: Secondary | ICD-10-CM

## 2018-03-21 DIAGNOSIS — R059 Cough, unspecified: Secondary | ICD-10-CM

## 2018-03-21 MED ORDER — AMOXICILLIN-POT CLAVULANATE 875-125 MG PO TABS: 1.0000 | ORAL_TABLET | Freq: Two times a day (BID) | ORAL | 0 refills | Status: DC

## 2018-03-21 MED ORDER — ESOMEPRAZOLE MAGNESIUM 40 MG PO CPDR: 40.0000 mg | DELAYED_RELEASE_CAPSULE | Freq: Every day | ORAL | 0 refills | Status: DC

## 2018-03-21 NOTE — Progress Notes (Signed)
   Subjective:    Patient ID: Elizabeth [name], female    DOB: 06-13-1965, 53 y.o.   MRN: [id number]  HPI Here for 6 weeks of coughing that will not go away. She was seen here on 02-12-18 and was given Levaquin. This did not help at all and she returned on 02-21-18. That day she was given Doxycycline and this helped a little. She still has chest congestion and a dry cough, but she also has some sinus congestion and PND. No fever. She thought her cough may be coming from GERD so about 10 days ago she started back on Nexium 40 mg BID. This has not helped at all however.    Review of Systems  Constitutional: Negative.   HENT: Positive for congestion, postnasal drip, sinus pressure and sinus pain. Negative for sore throat.   Eyes: Negative.   Respiratory: Positive for cough and chest tightness. Negative for shortness of breath and wheezing.   Cardiovascular: Negative.        Objective:   Physical Exam Constitutional:      General: She is not in acute distress.    Appearance: Normal appearance.  HENT:     Right Ear: Tympanic membrane and ear canal normal.     Left Ear: Tympanic membrane and ear canal normal.     Nose: Nose normal.     Mouth/Throat:     Pharynx: Oropharynx is clear.  Eyes:     Conjunctiva/sclera: Conjunctivae normal.  Pulmonary:     Effort: Pulmonary effort is normal. No respiratory distress.     Breath sounds: Normal breath sounds. No stridor. No wheezing, rhonchi or rales.     Comments: CXR today is clear  Musculoskeletal:     Right lower leg: No edema.     Left lower leg: No edema.  Lymphadenopathy:     Cervical: No cervical adenopathy.  Neurological:     Mental Status: She is alert.           Assessment & Plan:  Chronic cough, most likely from a sinusitis. Treat with Augmentin for 14 days.  Gershon Crane, MD

## 2018-03-31 ENCOUNTER — Ambulatory Visit: Payer: Self-pay | Admitting: *Deleted

## 2018-03-31 MED ORDER — AMOXICILLIN-POT CLAVULANATE 875-125 MG PO TABS: 1.0000 | ORAL_TABLET | Freq: Two times a day (BID) | ORAL | 0 refills | Status: DC

## 2018-03-31 NOTE — Telephone Encounter (Signed)
I agree, she should take a full 28 days of Augmentin. Call in an additional #28 to start when the current supply is out. She can take Pepcid 20 mg OTC in the evenings for reflux (in addition to the Nexium)

## 2018-03-31 NOTE — Telephone Encounter (Signed)
Called and spoke with pt and she is aware of Dr. Fry's recs.  

## 2018-03-31 NOTE — Telephone Encounter (Signed)
Message from Elizabeth [name] sent at 03/31/2018 3:20 PM EST   Summary: cough, sinus pain   Patient was advised by [name] at [facility] to call bacl in 10 days and update on cough and cold. Still has cough and sinus pain.          Pt calling to state she was advised by Dr. [name] to return call to office to update on current symptoms. Pt was last seen by Dr. [name] on 03/21/18. Pt states she feels a little better and her sinus headaches are not as bad. Pt states she still has 3-4 days left of the prescribed antibiotics. Pt reports that she has not had any fevers. Pt wants to know if Dr. [name] would recommend that she complete the course to see how she would feel or if she would need an extended round of antibiotics. Pt also states that she is experiencing a dry cough that she thinks may be due to her acid reflux. Pt states she coughs all day but the cough goes away at night. Pt can be contacted at [phone] with recommendations.   Reason for Disposition . [1] Follow-up call from patient regarding patient's clinical status AND [2] information NON-URGENT  Answer Assessment - Initial Assessment Questions 1. REASON FOR CALL or QUESTION: "What is your reason for calling today?" or "How can I best help you?" or "What question do you have that I can help answer?"     Pt calling to give update on symptoms 2. CALLER: Document the source of call. (e.g., laboratory, patient).     patient  Protocols used: PCP CALL - NO TRIAGE-A-AH

## 2018-04-14 ENCOUNTER — Other Ambulatory Visit: Payer: Self-pay

## 2018-04-14 ENCOUNTER — Ambulatory Visit: Payer: BC Managed Care – PPO | Admitting: Obstetrics & Gynecology

## 2018-04-14 ENCOUNTER — Encounter: Payer: Self-pay | Admitting: Obstetrics & Gynecology

## 2018-04-14 VITALS — BP 120/70 | HR 76 | Resp 16 | Ht 64.5 in | Wt 151.0 lb

## 2018-04-14 DIAGNOSIS — Z0000 Encounter for general adult medical examination without abnormal findings: Secondary | ICD-10-CM

## 2018-04-14 DIAGNOSIS — N912 Amenorrhea, unspecified: Secondary | ICD-10-CM

## 2018-04-14 DIAGNOSIS — Z01419 Encounter for gynecological examination (general) (routine) without abnormal findings: Secondary | ICD-10-CM

## 2018-04-14 NOTE — Progress Notes (Signed)
53 y.o. G0P0000 Divorced White or Caucasian female here for annual exam.  Doing well.  Has been experiencing some sinus issues.  Has been on antibiotics 3 times, with the last time lasting 30 days.    Denis vaginal bleeding.  Denies vaginal discharge.    No LMP recorded. (Menstrual status: Oral contraceptives).          Sexually active: No.  The current method of family planning is OCP (estrogen/progesterone) and abstinence.    Exercising: Yes.    walking Smoker:  no  Health Maintenance: Pap:  01/03/16 neg. HR HPV:neg   09/09/13 neg  History of abnormal Pap:  no MMG:  04/15/17 BIRADS1:Neg.  [facility]oscopy:  01/15/14 f/u 10 years  BMD:   Never TDaP:  2016 Pneumonia vaccine(s):  n/a Shingrix:   No Hep C testing: 06/10/15 Neg  Screening Labs: having done today   reports that she has never smoked. She has never used smokeless tobacco. She reports current alcohol use of about 9.0 standard drinks of alcohol per week. She reports that she does not use drugs.  Past Medical History:  Diagnosis Date  . Allergy    sees Dr. Lucie Leather  . GERD (gastroesophageal reflux disease)   . Hematuria    asymptomatic  . Plantar fasciitis   . Scoliosis    mild thoracolumnar-per Dr Lesia Hausen note in [facility]    Past Surgical History:  Procedure Laterality Date  . TONSILLECTOMY    . UPPER GI ENDOSCOPY      Current Outpatient Medications  Medication Sig Dispense Refill  . amoxicillin-clavulanate (AUGMENTIN) 875-125 MG tablet Take 1 tablet by mouth 2 (two) times daily. 28 tablet 0  . esomeprazole (NEXIUM) 40 MG capsule Take 1 capsule (40 mg total) by mouth daily at 12 noon. 90 capsule 0  . norethindrone-ethinyl estradiol (JUNEL FE 1/20) 1-20 MG-MCG tablet Take 1 tablet by mouth daily. 84 tablet 4  . valACYclovir (VALTREX) 1000 MG tablet TAKE 1 TABLET BY MOUTH DAILY. 30 tablet 1  . zolpidem (AMBIEN) 10 MG tablet TAKE 1 TABLET BY MOUTH AT BEDTIME IF NEEDED FOR SLEEP.  15 tablet 2   No current facility-administered medications for this visit.     Family History  Problem Relation Age of Onset  . Heart disease Father   . Hypertension Brother   . Other Mother        AFIB  . Colon cancer Neg Hx     Review of Systems  All other systems reviewed and are negative.   Exam:   BP 120/70 (BP Location: Right Arm, Patient Position: Sitting, Cuff Size: Large)   Pulse 76   Resp 16   Ht 5' 4.5" (1.638 m)   Wt 151 lb (68.5 kg)   BMI 25.52 kg/m    Height: 5' 4.5" (163.8 cm)  Ht Readings from Last 3 Encounters:  04/14/18 5' 4.5" (1.638 m)  04/12/17 5\' 4"  (1.626 m)  10/24/16 5\' 5"  (1.651 m)    General appearance: alert, cooperative and appears stated age Head: Normocephalic, without obvious abnormality, atraumatic Neck: no adenopathy, supple, symmetrical, trachea midline and thyroid normal to inspection and palpation Lungs: clear to auscultation bilaterally Breasts: normal appearance, no masses or tenderness Heart: regular rate and rhythm Abdomen: soft, non-tender; bowel sounds normal; no masses,  no organomegaly Extremities: extremities normal, atraumatic, no cyanosis or edema Skin: Skin color, texture, turgor normal. No rashes or lesions Lymph nodes: Cervical, supraclavicular, and axillary nodes normal. No abnormal  inguinal nodes palpated Neurologic: Grossly normal  Pelvic: External genitalia:  no lesions              Urethra:  normal appearing urethra with no masses, tenderness or lesions              Bartholins and Skenes: normal                 Vagina: normal appearing vagina with normal color and discharge, no lesions              Cervix: no lesions              Pap taken: No. Bimanual Exam:  Uterus:  normal size, contour, position, consistency, mobility, non-tender              Adnexa: normal adnexa and no mass, fullness, tenderness               Rectovaginal: Confirms               Anus:  normal sphincter tone, no lesions  Chaperone was  present for exam.  A:  Well Woman with normal exan On OCPs for contraception H/O HSV 2 H/o microscopic hematuria with urology evaluation (per pt)  P:   Mammogram guidelines reviewed.  Pt aware due and will scheduled pap smear neg with neg HR HPV 11/17.  Not indicated today FSH and AMH obtained today CMP, CBC, Lipids, TSH and Vit D obtained today No RF for Junel 1/20 done today Valtrex rx not needed Colonoscopy is UTD Shingrix vaccination discussed and location for administration discussed eturn annually or prn

## 2018-04-14 NOTE — Patient Instructions (Addendum)
[facility]patient [facility] at [facility] [address] [place], [place] [address]  Main: [phone]    Zoster Vaccine, Recombinant injection What is this medicine? ZOSTER VACCINE (ZOS ter vak SEEN) is used to prevent shingles in adults 55 years old and over. This vaccine is not used to treat shingles or nerve pain from shingles. This medicine may be used for other purposes; ask your health care provider or pharmacist if you have questions. COMMON BRAND NAME(S): SHINGRIX What should I tell my health care provider before I take this medicine? They need to know if you have any of these conditions: -blood disorders or disease -cancer like leukemia or lymphoma -immune system problems or therapy -an unusual or allergic reaction to vaccines, other medications, foods, dyes, or preservatives -pregnant or trying to get pregnant -breast-feeding How should I use this medicine? This vaccine is for injection in a muscle. It is given by a health care professional. Talk to your pediatrician regarding the use of this medicine in children. This medicine is not approved for use in children. Overdosage: If you think you have taken too much of this medicine contact a poison control center or emergency room at once. NOTE: This medicine is only for you. Do not share this medicine with others. What if I miss a dose? Keep appointments for follow-up (booster) doses as directed. It is important not to miss your dose. Call your doctor or health care professional if you are unable to keep an appointment. What may interact with this medicine? -medicines that suppress your immune system -medicines to treat cancer -steroid medicines like prednisone or cortisone This list may not describe all possible interactions. Give your health care provider a list of all the medicines, herbs, non-prescription drugs, or dietary supplements you use. Also tell them if you smoke, drink alcohol, or use illegal drugs. Some items may  interact with your medicine. What should I watch for while using this medicine? Visit your doctor for regular check ups. This vaccine, like all vaccines, may not fully protect everyone. What side effects may I notice from receiving this medicine? Side effects that you should report to your doctor or health care professional as soon as possible: -allergic reactions like skin rash, itching or hives, swelling of the face, lips, or tongue -breathing problems Side effects that usually do not require medical attention (report these to your doctor or health care professional if they continue or are bothersome): -chills -headache -fever -nausea, vomiting -redness, warmth, pain, swelling or itching at site where injected -tiredness This list may not describe all possible side effects. Call your doctor for medical advice about side effects. You may report side effects to FDA at 1-800-FDA-1088. Where should I keep my medicine? This vaccine is only given in a clinic, pharmacy, doctor's office, or other health care setting and will not be stored at home. NOTE: This sheet is a summary. It may not cover all possible information. If you have questions about this medicine, talk to your doctor, pharmacist, or health care provider.  2019 Elsevier/Gold Standard (2016-09-03 13:20:30)  

## 2018-04-17 LAB — CBC
Hematocrit: 37.2 % (ref 34.0–46.6)
Hemoglobin: 12.6 g/dL (ref 11.1–15.9)
MCH: 29.8 pg (ref 26.6–33.0)
MCHC: 33.9 g/dL (ref 31.5–35.7)
MCV: 88 fL (ref 79–97)
Platelets: 367 10*3/uL (ref 150–450)
RBC: 4.23 x10E6/uL (ref 3.77–5.28)
RDW: 12.9 % (ref 11.7–15.4)
WBC: 5.7 10*3/uL (ref 3.4–10.8)

## 2018-04-17 LAB — FOLLICLE STIMULATING HORMONE: FSH: 12.2 m[IU]/mL

## 2018-04-17 LAB — COMPREHENSIVE METABOLIC PANEL
ALT: 12 IU/L (ref 0–32)
AST: 9 IU/L (ref 0–40)
Albumin/Globulin Ratio: 1.9 (ref 1.2–2.2)
Albumin: 4.6 g/dL (ref 3.8–4.9)
Alkaline Phosphatase: 32 IU/L — ABNORMAL LOW (ref 39–117)
BUN/Creatinine Ratio: 15 (ref 9–23)
BUN: 11 mg/dL (ref 6–24)
Bilirubin Total: 0.4 mg/dL (ref 0.0–1.2)
CHLORIDE: 101 mmol/L (ref 96–106)
CO2: 22 mmol/L (ref 20–29)
Calcium: 9.1 mg/dL (ref 8.7–10.2)
Creatinine, Ser: 0.71 mg/dL (ref 0.57–1.00)
GFR calc non Af Amer: 96 mL/min/{1.73_m2} (ref >59)
GFR, EST AFRICAN AMERICAN: 111 mL/min/{1.73_m2} (ref >59)
Globulin, Total: 2.4 g/dL (ref 1.5–4.5)
Glucose: 92 mg/dL (ref 65–99)
Potassium: 4.6 mmol/L (ref 3.5–5.2)
Sodium: 139 mmol/L (ref 134–144)
Total Protein: 7 g/dL (ref 6.0–8.5)

## 2018-04-17 LAB — TSH: TSH: 0.958 u[IU]/mL (ref 0.450–4.500)

## 2018-04-17 LAB — LIPID PANEL
Chol/HDL Ratio: 2.9 ratio (ref 0.0–4.4)
Cholesterol, Total: 173 mg/dL (ref 100–199)
HDL: 60 mg/dL (ref >39)
LDL Calculated: 81 mg/dL (ref 0–99)
Triglycerides: 158 mg/dL — ABNORMAL HIGH (ref 0–149)
VLDL CHOLESTEROL CAL: 32 mg/dL (ref 5–40)

## 2018-04-17 LAB — VITAMIN D 25 HYDROXY (VIT D DEFICIENCY, FRACTURES): Vit D, 25-Hydroxy: 31.6 ng/mL (ref 30.0–100.0)

## 2018-04-17 LAB — ANTI MULLERIAN HORMONE: ANTI-MULLERIAN HORMONE (AMH): <0.015 ng/mL

## 2018-04-23 ENCOUNTER — Other Ambulatory Visit: Payer: Self-pay | Admitting: Obstetrics & Gynecology

## 2018-04-23 NOTE — Telephone Encounter (Signed)
Spoke with patient.   1. H/o HSV2. Requeting refills, none sent at [facility] on 04/14/18.  Not out of medication, no current outbreak. Rx pended for Valtrex 1000 mg tab #30/1RF.  2. Patient requesting RX for Nasonex. Previously filled by PCP. Reports cough that is not new, has been experiencing "sinus issues gets cough with reflux". Has old Rx she has been using and this has been providing relief.  Denies fever/chills, fatigue.   Advised I will review with Dr. Hyacinth Meeker and return call with recommendations. Patient aware Dr. Hyacinth Meeker is out of the office, will f/u on 3/19. Patient agreeable.   Dr. Hyacinth Meeker -please advise on valtrex and nasonex RX.

## 2018-04-23 NOTE — Telephone Encounter (Signed)
Patient is asking for a refill of her Valacyclovir to [facility]. She also asked if Dr.Miller would be willing to send a prescription for her Nasonex. She understands if Dr.Miller will not prescribe the Nasonex just thought she would ask.

## 2018-04-25 ENCOUNTER — Other Ambulatory Visit: Payer: Self-pay | Admitting: Obstetrics & Gynecology

## 2018-04-25 MED ORDER — VALACYCLOVIR HCL 1 G PO TABS: 1000.0000 mg | ORAL_TABLET | Freq: Every day | ORAL | 1 refills | Status: DC

## 2018-04-25 MED ORDER — MOMETASONE FUROATE 50 MCG/ACT NA SUSP: 2.0000 | Freq: Every day | NASAL | 3 refills | Status: DC

## 2018-04-25 NOTE — Progress Notes (Signed)
nasonex rx to pharmacy

## 2018-04-29 NOTE — Telephone Encounter (Signed)
Left message advising requested refills sent to pharmacy on 04/25/18, return call to office if any additional questions.

## 2018-04-30 ENCOUNTER — Telehealth: Payer: Self-pay

## 2018-04-30 NOTE — Telephone Encounter (Signed)
-----   Message from Jerene Bears, MD sent at 04/30/2018  6:39 AM EDT ----- Could you please let pt know her AMH looks like she fully menopausal as it is undetectable.  CBC< CMP, Lipids, Vit D were all fine.  I do want her to stop her OCPs.  Her options are: 1) stop the OCPs and see if she has any symptoms (which of course if my suggestion), start a progesterone only pill which is safe due to no estrogen (as she is very concerned about having any bleeding) or 3) transition to HRT right now which I would prescribe and she would finish her current pack of pills and go straight to the HRT.

## 2018-04-30 NOTE — Telephone Encounter (Signed)
Spoke with patient. Results given. Patient verbalizes understanding. Patient would like to stop OCP and see if she has any symptoms. Will call with any concerns or questions. Encounter closed.

## 2018-07-23 ENCOUNTER — Telehealth: Payer: Self-pay | Admitting: Obstetrics & Gynecology

## 2018-07-23 NOTE — Telephone Encounter (Signed)
Patient says pharmacy location may be [facility]  phone number is [phone]

## 2018-07-23 NOTE — Telephone Encounter (Signed)
Patient is calling requesting a refill on methylPREDNISolone 4MG  TBPK tablet for poison ivy.  Please advise,

## 2018-07-23 NOTE — Telephone Encounter (Signed)
Patient is calling requesting a refill on methylPREDNISolone 4MG  TBPK tablet for poison ivy.

## 2018-07-23 NOTE — Telephone Encounter (Signed)
Patient is calling regarding refill request. Patient also stated that she is currently in [place], [place] and would like it to be sent to [facility] at [address], [place], [place].

## 2018-07-28 ENCOUNTER — Telehealth: Payer: Self-pay | Admitting: Obstetrics & Gynecology

## 2018-07-28 MED ORDER — METHYLPREDNISOLONE 4 MG PO TBPK: ORAL_TABLET | ORAL | 0 refills | Status: DC

## 2018-07-28 NOTE — Telephone Encounter (Signed)
I'm very sorry about not taking care of this last week.  I remember the request but must have closed the encounter in error.  I am sorry.  Rx for medrol dose pak is pended.  [place]  Where does she want the rx sent?  If not improved in 24 hours (which it should be if it is poison ivy), she will need to be seen at urgent care or with PCP or dermatologist.

## 2018-07-28 NOTE — Telephone Encounter (Signed)
Patient is asking if Dr Sabra Heck would prescribe something for poison ivy like she has done before in the past.

## 2018-07-28 NOTE — Telephone Encounter (Signed)
Patient returned call

## 2018-07-28 NOTE — Telephone Encounter (Signed)
Spoke with patient, advised as seen below per Dr. Sabra Heck. Rx to [facility]. Patient thankful for return call.   Routing to provider for final review. Patient is agreeable to disposition. Will close encounter.

## 2018-07-28 NOTE — Telephone Encounter (Signed)
Spoke with patient. Patient reports a raised, red itchy, rash, oozing clear d/c behind both knees and lower back. Some areas are purple in color. Started 10 days ago. Has been applying triamcinolone 0.1% cream topically for itching, rash not resolving. Hx of poison ivy contact dermatitis, states Dr. Sabra Heck prescribed prednisone in 2018. Patient is requesting RX. Denies any other GYN symptoms, pain, fever/chills.   Advised poision ivy not typically treated by GYN, may require f/u with PCP. Patient is requesting Dr. Sabra Heck review. Advised I will forward to Dr. Sabra Heck to review, our office will return call with recommendations. Advised Dr. Sabra Heck is in the OR, response may not be immediate. Pharmacy on file confirmed.   Per review of OV 06/08/16: Medrol 4mg  dose pack prescribed for Poison ivy contact dermatitis.  Dr. Sabra Heck -please advise on Rx.

## 2018-07-28 NOTE — Telephone Encounter (Signed)
Left message to call Estanislao Harmon, RN at [facility] [phone].   

## 2018-08-13 ENCOUNTER — Encounter: Payer: Self-pay | Admitting: Family Medicine

## 2018-08-13 ENCOUNTER — Other Ambulatory Visit: Payer: Self-pay

## 2018-08-13 ENCOUNTER — Ambulatory Visit (INDEPENDENT_AMBULATORY_CARE_PROVIDER_SITE_OTHER): Payer: BC Managed Care – PPO | Admitting: Family Medicine

## 2018-08-13 DIAGNOSIS — S91311A Laceration without foreign body, right foot, initial encounter: Secondary | ICD-10-CM | POA: Diagnosis not present

## 2018-08-13 MED ORDER — CEPHALEXIN 500 MG PO CAPS: 500.0000 mg | ORAL_CAPSULE | Freq: Four times a day (QID) | ORAL | 0 refills | Status: AC

## 2018-08-13 NOTE — Progress Notes (Signed)
Subjective:    Patient ID: Elizabeth [name], female    DOB: 22-Mar-1965, 53 y.o.   MRN: [id number]  HPI Virtual Visit via Video Note  I connected with the patient on 08/13/18 at  3:15 PM EDT by a video enabled telemedicine application and verified that I am speaking with the correct person using two identifiers.  Location patient: home Location provider:work or home office Persons participating in the virtual visit: patient, provider  I discussed the limitations of evaluation and management by telemedicine and the availability of in person appointments. The patient expressed understanding and agreed to proceed.   HPI: Here for a puncture wound to the right heel that occurred 3 days ago. She was walking with flip flops on and stepped on the tine of a rake. This went through the shoe and into the bottom of her heel. It has been painful since then, but it is not red or warm. She has cleaned it with saltwater and peroxide. Her last tetanus shot was in 2-16.   ROS: See pertinent positives and negatives per HPI.  Past Medical History:  Diagnosis Date  . Allergy    sees Dr. Neldon Mc  . GERD (gastroesophageal reflux disease)   . Hematuria    asymptomatic  . Plantar fasciitis   . Scoliosis    mild thoracolumnar-per Dr Inda Merlin note in [place]    Past Surgical History:  Procedure Laterality Date  . TONSILLECTOMY    . UPPER GI ENDOSCOPY      Family History  Problem Relation Age of Onset  . Heart disease Father   . Hypertension Brother   . Other Mother        AFIB  . Colon cancer Neg Hx      Current Outpatient Medications:  .  amoxicillin-clavulanate (AUGMENTIN) 875-125 MG tablet, Take 1 tablet by mouth 2 (two) times daily., Disp: 28 tablet, Rfl: 0 .  esomeprazole (NEXIUM) 40 MG capsule, Take 1 capsule (40 mg total) by mouth daily at 12 noon., Disp: 90 capsule, Rfl: 0 .  methylPREDNISolone (MEDROL DOSEPAK) 4 MG TBPK tablet, Take as directed by dose pak., Disp: 21 tablet,  Rfl: 0 .  mometasone (NASONEX) 50 MCG/ACT nasal spray, Place 2 sprays into the nose daily., Disp: 17 g, Rfl: 3 .  norethindrone-ethinyl estradiol (JUNEL FE 1/20) 1-20 MG-MCG tablet, Take 1 tablet by mouth daily., Disp: 84 tablet, Rfl: 4 .  valACYclovir (VALTREX) 1000 MG tablet, Take 1 tablet (1,000 mg total) by mouth daily., Disp: 30 tablet, Rfl: 1 .  zolpidem (AMBIEN) 10 MG tablet, TAKE 1 TABLET BY MOUTH AT BEDTIME IF NEEDED FOR SLEEP., Disp: 15 tablet, Rfl: 2 .  cephALEXin (KEFLEX) 500 MG capsule, Take 1 capsule (500 mg total) by mouth 4 (four) times daily for 10 days., Disp: 40 capsule, Rfl: 0  EXAM: SKIN: the bottom of her right heel has a puncture wound that looks clean.  No swelling or erythema is visible  VITALS per patient if applicable:  GENERAL: alert, oriented, appears well and in no acute distress  HEENT: atraumatic, conjunttiva clear, no obvious abnormalities on inspection of external nose and ears  NECK: normal movements of the head and neck  LUNGS: on inspection no signs of respiratory distress, breathing rate appears normal, no obvious gross SOB, gasping or wheezing  CV: no obvious cyanosis  MS: moves all visible extremities without noticeable abnormality  PSYCH/NEURO: pleasant and cooperative, no obvious depression or anxiety, speech and thought processing grossly intact  ASSESSMENT AND  PLAN: Puncture wound. We will cover for infection with 10 days of Keflex. Recheck prn.  Gershon CraneStephen Fry, MD  Discussed the following assessment and plan:  No diagnosis found.     I discussed the assessment and treatment plan with the patient. The patient was provided an opportunity to ask questions and all were answered. The patient agreed with the plan and demonstrated an understanding of the instructions.   The patient was advised to call back or seek an in-person evaluation if the symptoms worsen or if the condition fails to improve as anticipated.     Review of Systems      Objective:   Physical Exam        Assessment & Plan:

## 2018-10-31 ENCOUNTER — Ambulatory Visit (INDEPENDENT_AMBULATORY_CARE_PROVIDER_SITE_OTHER): Payer: BC Managed Care – PPO | Admitting: Family Medicine

## 2018-10-31 ENCOUNTER — Encounter: Payer: Self-pay | Admitting: Family Medicine

## 2018-10-31 VITALS — Ht 64.5 in

## 2018-10-31 DIAGNOSIS — G4700 Insomnia, unspecified: Secondary | ICD-10-CM | POA: Diagnosis not present

## 2018-10-31 MED ORDER — ALPRAZOLAM 1 MG PO TABS: 1.0000 mg | ORAL_TABLET | Freq: Every evening | ORAL | 0 refills | Status: DC | PRN

## 2018-10-31 NOTE — Progress Notes (Signed)
   Subjective:    Patient ID: Elizabeth [name], female    DOB: 1965-08-30, 53 y.o.   MRN: [id number]  HPI Virtual Visit via Video Note  I connected with the patient on 10/31/18 at  1:00 PM EDT by a video enabled telemedicine application and verified that I am speaking with the correct person using two identifiers.  Location patient: home Location provider:work or home office Persons participating in the virtual visit: patient, provider  I discussed the limitations of evaluation and management by telemedicine and the availability of in person appointments. The patient expressed understanding and agreed to proceed.   HPI: Here to discuss trouble sleeping. She tends to fall asleep quickly but she wakes up in the middle of the night feeling anxious and thinking about all the things she needs to do. Ambien helped a little but left her feeling hung over.    ROS: See pertinent positives and negatives per HPI.  Past Medical History:  Diagnosis Date  . Allergy    sees Dr. Neldon Mc  . GERD (gastroesophageal reflux disease)   . Hematuria    asymptomatic  . Plantar fasciitis   . Scoliosis    mild thoracolumnar-per Dr Inda Merlin note in [place]    Past Surgical History:  Procedure Laterality Date  . TONSILLECTOMY    . UPPER GI ENDOSCOPY      Family History  Problem Relation Age of Onset  . Heart disease Father   . Hypertension Brother   . Other Mother        AFIB  . Colon cancer Neg Hx      Current Outpatient Medications:  .  mometasone (NASONEX) 50 MCG/ACT nasal spray, Place 2 sprays into the nose daily., Disp: 17 g, Rfl: 3 .  valACYclovir (VALTREX) 1000 MG tablet, Take 1 tablet (1,000 mg total) by mouth daily., Disp: 30 tablet, Rfl: 1 .  ALPRAZolam (XANAX) 1 MG tablet, Take 1 tablet (1 mg total) by mouth at bedtime as needed for sleep., Disp: 30 tablet, Rfl: 0  EXAM:  VITALS per patient if applicable:  GENERAL: alert, oriented, appears well and in no acute distress   HEENT: atraumatic, conjunttiva clear, no obvious abnormalities on inspection of external nose and ears  NECK: normal movements of the head and neck  LUNGS: on inspection no signs of respiratory distress, breathing rate appears normal, no obvious gross SOB, gasping or wheezing  CV: no obvious cyanosis  MS: moves all visible extremities without noticeable abnormality  PSYCH/NEURO: pleasant and cooperative, no obvious depression or anxiety, speech and thought processing grossly intact  ASSESSMENT AND PLAN: Insomnia, try Xanax 1 mg qhs. Recheck in 3-4 weeks.  Alysia Penna, MD  Discussed the following assessment and plan:  No diagnosis found.     I discussed the assessment and treatment plan with the patient. The patient was provided an opportunity to ask questions and all were answered. The patient agreed with the plan and demonstrated an understanding of the instructions.   The patient was advised to call back or seek an in-person evaluation if the symptoms worsen or if the condition fails to improve as anticipated.     Review of Systems     Objective:   Physical Exam        Assessment & Plan:

## 2018-12-03 ENCOUNTER — Other Ambulatory Visit: Payer: Self-pay

## 2018-12-03 ENCOUNTER — Ambulatory Visit (INDEPENDENT_AMBULATORY_CARE_PROVIDER_SITE_OTHER): Payer: BC Managed Care – PPO

## 2018-12-03 ENCOUNTER — Encounter: Payer: Self-pay | Admitting: Family Medicine

## 2018-12-03 ENCOUNTER — Ambulatory Visit: Payer: Self-pay | Admitting: *Deleted

## 2018-12-03 ENCOUNTER — Ambulatory Visit (INDEPENDENT_AMBULATORY_CARE_PROVIDER_SITE_OTHER): Payer: BC Managed Care – PPO | Admitting: Family Medicine

## 2018-12-03 ENCOUNTER — Ambulatory Visit: Payer: BC Managed Care – PPO | Admitting: Family Medicine

## 2018-12-03 ENCOUNTER — Telehealth: Payer: BC Managed Care – PPO | Admitting: Family Medicine

## 2018-12-03 VITALS — BP 118/76 | HR 94 | Temp 97.6°F | Ht 64.5 in | Wt 147.6 lb

## 2018-12-03 DIAGNOSIS — R1013 Epigastric pain: Secondary | ICD-10-CM | POA: Diagnosis not present

## 2018-12-03 NOTE — Telephone Encounter (Signed)
Pt called with complaints of abdominal pain x 3 days; she says the pain is in her upper shoots under her sternum; the pain is intermittent, and is worse in the morning; she has taken anti-gas medication that has not helped; the pt says at times she felt like it was indigestion; her temp was 99.1 and 99.5 PM 10/27 and PM 10/26; the pt also complains of intermittent nausea; recommendations made per nurse triage protocol; she verbalized understanding; the pt sees Dr Sarajane Jews, [facility];  pt transferred to [facility] for scheduling.  Reason for Disposition . [1] MILD-MODERATE pain AND [2] not relieved by antacids  Answer Assessment - Initial Assessment Questions 1. LOCATION: "Where does it hurt?"      Upper stomach 2. RADIATION: "Does the pain shoot anywhere else?" (e.g., chest, back)   Shoots under sternum 3. ONSET: "When did the pain begin?" (e.g., minutes, hours or days ago)      11/30/2018 4. SUDDEN: "Gradual or sudden onset?"    sudden 5. PATTERN "Does the pain come and go, or is it constant?"    - If constant: "Is it getting better, staying the same, or worsening?"      (Note: Constant means the pain never goes away completely; most serious pain is constant and it progresses)     - If intermittent: "How long does it last?" "Do you have pain now?"     (Note: Intermittent means the pain goes away completely between bouts)     inermittent 6. SEVERITY: "How bad is the pain?"  (e.g., Scale 1-10; mild, moderate, or severe)    - MILD (1-3): doesn't interfere with normal activities, abdomen soft and not tender to touch     - MODERATE (4-7): interferes with normal activities or awakens from sleep, tender to touch     - SEVERE (8-10): excruciating pain, doubled over, unable to do any normal activities       6-7 out of 10 7. RECURRENT SYMPTOM: "Have you ever had this type of abdominal pain before?" If so, ask: "When was the last time?" and "What happened that time?"      no 8. AGGRAVATING FACTORS:  "Does anything seem to cause this pain?" (e.g., foods, stress, alcohol)    Feels more when laying down flat, especially when laying on stomach 9. CARDIAC SYMPTOMS: "Do you have any of the following symptoms: chest pain, difficulty breathing, sweating, nausea?"     Intermittent chest pain that results from pain shooting up her stomach 10. OTHER SYMPTOMS: "Do you have any other symptoms?" (e.g., fever, vomiting, diarrhea)       nausea 11. PREGNANCY: "Is there any chance you are pregnant?" "When was your last menstrual period?"      No menopause  Protocols used: ABDOMINAL PAIN - UPPER-A-AH

## 2018-12-03 NOTE — Progress Notes (Signed)
Subjective:     Patient ID: Elizabeth [name], female   DOB: August 28, 1965, 53 y.o.   MRN: [id number]  HPI   Elizabeth [name] had virtual visit this morning and we decided to convert this to office follow-up.  She states that Monday she had onset of sharp shooting type pain lasting just a few seconds substernal and upper epigastric area.  She had initially some question of low-grade fever.  No cough.  No respiratory symptoms.  Slightly diminished appetite but no recent weight loss.  She had sensation of fullness in her upper abdomen and some intermittent nausea without vomiting.  She thought this may be gas related and took some simethicone over-the-counter without relief.  She has had some increased burping.  Has taken Nexium in the past but currently not taking any acid suppressors.  Denies any classic GERD type symptoms.  No chest pain and no exertional symptoms.   Denies weight loss.  No history of ulcers.  No melena.  No dysphagia.  No cough.  No postnasal drip symptoms.  She had colonoscopy 2015 with sigmoid diverticulosis.  No transverse colon diverticulosis.  EGD 2013 unremarkable  She has never smoked.  Past Medical History:  Diagnosis Date  . Allergy    sees Dr. Neldon Mc  . GERD (gastroesophageal reflux disease)   . Hematuria    asymptomatic  . Plantar fasciitis   . Scoliosis    mild thoracolumnar-per Dr Inda Merlin note in [place]   Past Surgical History:  Procedure Laterality Date  . TONSILLECTOMY    . UPPER GI ENDOSCOPY      reports that she has never smoked. She has never used smokeless tobacco. She reports current alcohol use of about 9.0 standard drinks of alcohol per week. She reports that she does not use drugs. family history includes Heart disease in her father; Hypertension in her brother; Other in her mother. Allergies  Allergen Reactions  . Protonix [Pantoprazole Sodium] Rash     Review of Systems  Constitutional: Positive for appetite change. Negative for chills, fever  and unexpected weight change.  HENT: Negative for sore throat and trouble swallowing.   Respiratory: Negative for cough and shortness of breath.   Cardiovascular: Negative for chest pain.  Gastrointestinal: Positive for abdominal pain and nausea. Negative for abdominal distention, blood in stool and vomiting.  Genitourinary: Negative for dysuria.  Neurological: Negative for dizziness.       Objective:   Physical Exam Constitutional:      Appearance: Normal appearance.  Neck:     Musculoskeletal: Neck supple.  Cardiovascular:     Rate and Rhythm: Normal rate and regular rhythm.  Pulmonary:     Effort: Pulmonary effort is normal.     Breath sounds: Normal breath sounds.  Abdominal:     General: Abdomen is flat. Bowel sounds are normal. There is no distension.     Palpations: Abdomen is soft. There is no mass.     Tenderness: There is no guarding or rebound.     Hernia: No hernia is present.     Comments: No reproducible tenderness at this time other than very mild tenderness epigastric region.  No guarding or rebound.  No right upper quadrant tenderness  Lymphadenopathy:     Cervical: No cervical adenopathy.  Neurological:     Mental Status: She is alert.        Assessment:     3-day history of fleeting upper abdominal pain usually lasting several seconds.  Question gaseous versus neuropathic given  fleeting nature.  She does not have any red flags such as weight loss, vomiting, melena    Plan:     -Check CBC and comprehensive metabolic panel and amylase -Check 2 view abdomen with chest x-ray -she will start on OTC Pepcid 20 mg bid -follow up immediately for any progressive pain, vomiting, melena, or other concerns.  Kristian Covey MD [facility] at [facility]

## 2018-12-03 NOTE — Patient Instructions (Signed)
Abdominal Pain, Adult Abdominal pain can be caused by many things. Often, abdominal pain is not serious and it gets better with no treatment or by being treated at home. However, sometimes abdominal pain is serious. Your health care provider will do a medical history and a physical exam to try to determine the cause of your abdominal pain. Follow these instructions at home:  Take over-the-counter and prescription medicines only as told by your health care provider. Do not take a laxative unless told by your health care provider.  Drink enough fluid to keep your urine clear or pale yellow.  Watch your condition for any changes.  Keep all follow-up visits as told by your health care provider. This is important. Contact a health care provider if:  Your abdominal pain changes or gets worse.  You are not hungry or you lose weight without trying.  You are constipated or have diarrhea for more than 2-3 days.  You have pain when you urinate or have a bowel movement.  Your abdominal pain wakes you up at night.  Your pain gets worse with meals, after eating, or with certain foods.  You are throwing up and cannot keep anything down.  You have a fever. Get help right away if:  Your pain does not go away as soon as your health care provider told you to expect.  You cannot stop throwing up.  Your pain is only in areas of the abdomen, such as the right side or the left lower portion of the abdomen.  You have bloody or black stools, or stools that look like tar.  You have severe pain, cramping, or bloating in your abdomen.  You have signs of dehydration, such as: ? Dark urine, very little urine, or no urine. ? Cracked lips. ? Dry mouth. ? Sunken eyes. ? Sleepiness. ? Weakness. This information is not intended to replace advice given to you by your health care provider. Make sure you discuss any questions you have with your health care provider. Document Released: 11/01/2004 Document  Revised: 08/12/2015 Document Reviewed: 07/06/2015 Elsevier Interactive Patient Education  [place].  Consider OTC Pepcid 20 mg twice daily.

## 2018-12-04 ENCOUNTER — Other Ambulatory Visit: Payer: Self-pay | Admitting: Family Medicine

## 2018-12-04 LAB — COMPREHENSIVE METABOLIC PANEL
ALT: 9 U/L (ref 0–35)
AST: 12 U/L (ref 0–37)
Albumin: 4.4 g/dL (ref 3.5–5.2)
Alkaline Phosphatase: 50 U/L (ref 39–117)
BUN: 9 mg/dL (ref 6–23)
CO2: 30 mEq/L (ref 19–32)
Calcium: 9.4 mg/dL (ref 8.4–10.5)
Chloride: 101 mEq/L (ref 96–112)
Creatinine, Ser: 0.67 mg/dL (ref 0.40–1.20)
GFR: 91.11 mL/min (ref >60.00)
Glucose, Bld: 101 mg/dL — ABNORMAL HIGH (ref 70–99)
Potassium: 4 mEq/L (ref 3.5–5.1)
Sodium: 140 mEq/L (ref 135–145)
Total Bilirubin: 0.5 mg/dL (ref 0.2–1.2)
Total Protein: 7.2 g/dL (ref 6.0–8.3)

## 2018-12-04 LAB — CBC WITH DIFFERENTIAL/PLATELET
Basophils Absolute: 0.1 10*3/uL (ref 0.0–0.1)
Basophils Relative: 0.8 % (ref 0.0–3.0)
Eosinophils Absolute: 0.2 10*3/uL (ref 0.0–0.7)
Eosinophils Relative: 2.6 % (ref 0.0–5.0)
HCT: 43.5 % (ref 36.0–46.0)
Hemoglobin: 14.7 g/dL (ref 12.0–15.0)
Lymphocytes Relative: 25.6 % (ref 12.0–46.0)
Lymphs Abs: 2 10*3/uL (ref 0.7–4.0)
MCHC: 33.8 g/dL (ref 30.0–36.0)
MCV: 93.2 fl (ref 78.0–100.0)
Monocytes Absolute: 0.7 10*3/uL (ref 0.1–1.0)
Monocytes Relative: 8.7 % (ref 3.0–12.0)
Neutro Abs: 4.9 10*3/uL (ref 1.4–7.7)
Neutrophils Relative %: 62.3 % (ref 43.0–77.0)
Platelets: 391 10*3/uL (ref 150.0–400.0)
RBC: 4.67 Mil/uL (ref 3.87–5.11)
RDW: 12.3 % (ref 11.5–15.5)
WBC: 7.8 10*3/uL (ref 4.0–10.5)

## 2018-12-04 LAB — AMYLASE: Amylase: 39 U/L (ref 27–131)

## 2018-12-04 NOTE — Telephone Encounter (Signed)
Ok for refill? 

## 2018-12-16 ENCOUNTER — Other Ambulatory Visit: Payer: Self-pay | Admitting: Obstetrics & Gynecology

## 2018-12-16 DIAGNOSIS — Z1231 Encounter for screening mammogram for malignant neoplasm of breast: Secondary | ICD-10-CM

## 2019-02-09 ENCOUNTER — Other Ambulatory Visit: Payer: Self-pay

## 2019-02-09 ENCOUNTER — Ambulatory Visit
Admission: RE | Admit: 2019-02-09 | Discharge: 2019-02-09 | Disposition: A | Discharge status: Home / Self Care | Payer: BC Managed Care – PPO | Source: Ambulatory Visit | Attending: Obstetrics & Gynecology | Admitting: Obstetrics & Gynecology

## 2019-02-09 DIAGNOSIS — Z1231 Encounter for screening mammogram for malignant neoplasm of breast: Secondary | ICD-10-CM

## 2019-02-12 ENCOUNTER — Encounter: Payer: Self-pay | Admitting: Family Medicine

## 2019-02-12 ENCOUNTER — Telehealth (INDEPENDENT_AMBULATORY_CARE_PROVIDER_SITE_OTHER): Payer: BC Managed Care – PPO | Admitting: Family Medicine

## 2019-02-12 ENCOUNTER — Other Ambulatory Visit: Payer: Self-pay

## 2019-02-12 VITALS — Temp 98.6°F

## 2019-02-12 DIAGNOSIS — J019 Acute sinusitis, unspecified: Secondary | ICD-10-CM

## 2019-02-12 DIAGNOSIS — T7840XA Allergy, unspecified, initial encounter: Secondary | ICD-10-CM

## 2019-02-12 MED ORDER — DOXYCYCLINE HYCLATE 100 MG PO TABS: 100.0000 mg | ORAL_TABLET | Freq: Two times a day (BID) | ORAL | 0 refills | Status: DC

## 2019-02-12 MED ORDER — BENZONATATE 100 MG PO CAPS: 100.0000 mg | ORAL_CAPSULE | Freq: Two times a day (BID) | ORAL | 0 refills | Status: DC | PRN

## 2019-02-12 NOTE — Progress Notes (Signed)
Virtual Visit via Video Note  I connected with [place]  on 02/12/19 at 11:20 AM EST by a video enabled telemedicine application and verified that I am speaking with the correct person using two identifiers.  Location patient: home Location provider:work or home office Persons participating in the virtual visit: patient, provider  I discussed the limitations of evaluation and management by telemedicine and the availability of in person appointments. The patient expressed understanding and agreed to proceed.   HPI:  Acute visit for "sinusitis": -reports started getting sick about 10 days ago, was getting better - but now is getting worse -reports has a history of sinusitis - last infection about 1 year ago -has had some frontal discomfort, taking ibuprofen -she has a history of allergies, not on any chronic medications for this -she thinks this started with blowing leaves a few weeks ago -she denies any exposures to folks outside the home or known COVID exposures, she is staying away from her parents since getting sick just in case but does not think she could have gotten COVID -denies any allergies to abx -denies body aches, fevers, SOB, complete loss of taste or smell -reports in the past augmentin did not work for her sinusitis and she had to take a month of antibiotics  ROS: See pertinent positives and negatives per HPI.  Past Medical History:  Diagnosis Date  . Allergy    sees Dr. Neldon Mc  . GERD (gastroesophageal reflux disease)   . Hematuria    asymptomatic  . Plantar fasciitis   . Scoliosis    mild thoracolumnar-per Dr Inda Merlin note in [place]    Past Surgical History:  Procedure Laterality Date  . TONSILLECTOMY    . UPPER GI ENDOSCOPY      Family History  Problem Relation Age of Onset  . Heart disease Father   . Hypertension Brother   . Other Mother        AFIB  . Colon cancer Neg Hx     SOCIAL HX: see hpi   Current Outpatient Medications:  .   ALPRAZolam (XANAX) 1 MG tablet, TAKE 1 TABLET BY MOUTH AT BEDTIME IF NEEDED FOR SLEEP., Disp: 30 tablet, Rfl: 5 .  Multiple Vitamins-Minerals (MULTIVITAMIN ADULTS PO), Take by mouth., Disp: , Rfl:  .  valACYclovir (VALTREX) 1000 MG tablet, Take 1 tablet (1,000 mg total) by mouth daily. (Patient taking differently: Take 1,000 mg by mouth as needed. ), Disp: 30 tablet, Rfl: 1 .  benzonatate (TESSALON PERLES) 100 MG capsule, Take 1 capsule (100 mg total) by mouth 2 (two) times daily as needed., Disp: 20 capsule, Rfl: 0 .  doxycycline (VIBRA-TABS) 100 MG tablet, Take 1 tablet (100 mg total) by mouth 2 (two) times daily., Disp: 14 tablet, Rfl: 0  EXAM:  VITALS per patient if applicable:denies fever  GENERAL: alert, oriented, appears well and in no acute distress  HEENT: atraumatic, conjunttiva clear, no obvious abnormalities on inspection of external nose and ears  NECK: normal movements of the head and neck  LUNGS: on inspection no signs of respiratory distress, breathing rate appears normal, no obvious gross SOB, gasping or wheezing  CV: no obvious cyanosis  MS: moves all visible extremities without noticeable abnormality  PSYCH/NEURO: pleasant and cooperative, no obvious depression or anxiety, speech and thought processing grossly intact  ASSESSMENT AND PLAN:  Discussed the following assessment and plan:  Acute non-recurrent sinusitis, unspecified location  -we discussed possible serious and likely etiologies, options for evaluation and workup, limitations of telemedicine  visit vs in person visit, treatment, treatment risks and precautions. Pt prefers to treat via telemedicine empirically rather then risking or undertaking an in person visit at this moment. Suspect sinusitis, likely developed from chronic congestion related to allergies. Did discuss other possibilities and the possibility of COVID19, testing options, isolation etc...though felt to be less likely. Discussed options for  management of chronic allergies, antihistamine, INS, nasal saline. She opted for doxy 100mg  bid x 7 days for sinusitis and tessalon for cough after discussion significant risks. Patient agrees to seek prompt in person care if worsening, new symptoms arise, or if is not improving with treatment.   I discussed the assessment and treatment plan with the patient. The patient was provided an opportunity to ask questions and all were answered. The patient agreed with the plan and demonstrated an understanding of the instructions.   The patient was advised to call back or seek an in-person evaluation if the symptoms worsen or if the condition fails to improve as anticipated.   , DO

## 2019-02-12 NOTE — Patient Instructions (Signed)
-  I sent the medication(s) we discussed to your pharmacy: Meds ordered this encounter  Medications  . doxycycline (VIBRA-TABS) 100 MG tablet    Sig: Take 1 tablet (100 mg total) by mouth 2 (two) times daily.    Dispense:  14 tablet    Refill:  0  . benzonatate (TESSALON PERLES) 100 MG capsule    Sig: Take 1 capsule (100 mg total) by mouth 2 (two) times daily as needed.    Dispense:  20 capsule    Refill:  0    Please let us know if you have any questions or concerns regarding this prescription.  I hope you are feeling better soon! Seek care promptly if your symptoms worsen, new concerns arise or you are not improving with treatment.

## 2019-02-16 ENCOUNTER — Telehealth: Payer: Self-pay | Admitting: Family Medicine

## 2019-02-16 MED ORDER — HYDROCODONE-HOMATROPINE 5-1.5 MG/5ML PO SYRP: 5.0000 mL | ORAL_SOLUTION | ORAL | 0 refills | Status: DC | PRN

## 2019-02-16 NOTE — Telephone Encounter (Signed)
Patient called in stating she would like, HYDROcodone-homatropine (HYDROMET) 5-1.5 MG/5ML syrup, refilled if possible. Pt states she saw Dr.Kim and she was unable to prescribe this medication for her so pt is wondering if PCP would be able to. Please advise.   [facility] - [place], [place] - [address] Phone:  [phone]  Fax:  [phone]

## 2019-02-16 NOTE — Telephone Encounter (Signed)
Ok for referral?

## 2019-02-16 NOTE — Telephone Encounter (Signed)
Copied from CRM [id number]. Topic: Referral - Request for Referral >> Feb 16, 2019 12:49 PM Reggie Pile, [place] wrote: Has patient seen PCP for this complaint?  No but saw Dr.Kim *If NO, is insurance requiring patient see PCP for this issue before PCP can refer them? Referral for which specialty: ENT Preferred provider/office: any that accept BCBS in [facility] Reason for referral: cough, get relief

## 2019-02-16 NOTE — Telephone Encounter (Signed)
I sent it in 

## 2019-02-16 NOTE — Telephone Encounter (Signed)
Patient has been made aware.

## 2019-02-25 ENCOUNTER — Ambulatory Visit (INDEPENDENT_AMBULATORY_CARE_PROVIDER_SITE_OTHER): Payer: BC Managed Care – PPO | Admitting: Otolaryngology

## 2019-02-25 ENCOUNTER — Other Ambulatory Visit: Payer: Self-pay

## 2019-02-25 ENCOUNTER — Encounter (INDEPENDENT_AMBULATORY_CARE_PROVIDER_SITE_OTHER): Payer: Self-pay | Admitting: Otolaryngology

## 2019-02-25 VITALS — Temp 98.1°F

## 2019-02-25 DIAGNOSIS — J310 Chronic rhinitis: Secondary | ICD-10-CM

## 2019-02-25 DIAGNOSIS — K219 Gastro-esophageal reflux disease without esophagitis: Secondary | ICD-10-CM | POA: Diagnosis not present

## 2019-02-25 NOTE — Progress Notes (Signed)
HPI: Elizabeth [name] is a 54 y.o. female who presents for evaluation of sinus complaints as well as intermittent coughing and throat congestion. She has been previously been diagnosed with GERD and has previously taken Nexium years ago but is not taking any antiacid medication presently. She does have history of allergies and has previously seen Dr. Lucie Leather. She complains of frequent pressure in her head when she has sinus infections as well as fullness in her ears. She also notices postnasal drainage. She doesn't really feel like she has a sinus infection presently. She is having no fever and no yellow-green discharge from her nose. She does have to frequently clear mucus from her throat however. She has had no hoarseness or voice problems.  Past Medical History:  Diagnosis Date  . Allergy    sees Dr. Lucie Leather  . GERD (gastroesophageal reflux disease)   . Hematuria    asymptomatic  . Plantar fasciitis   . Scoliosis    mild thoracolumnar-per Dr Lesia Hausen note in [facility]   Past Surgical History:  Procedure Laterality Date  . TONSILLECTOMY    . UPPER GI ENDOSCOPY     Social History   Socioeconomic History  . Marital status: Divorced    Spouse name: Not on file  . Number of children: 0  . Years of education: Not on file  . Highest education level: Not on file  Occupational History  . Occupation: Magazine features editor: [employer]  Tobacco Use  . Smoking status: Never Smoker  . Smokeless tobacco: Never Used  Substance and Sexual Activity  . Alcohol use: Yes    Alcohol/week: 9.0 standard drinks    Types: 9 Standard drinks or equivalent per week  . Drug use: No  . Sexual activity: Yes    Partners: Male    Birth control/protection: Pill  Other Topics Concern  . Not on file  Social History Narrative   No caffeine    Social Determinants of Health   Financial Resource Strain:   . Difficulty of Paying Living Expenses: Not on file  Food Insecurity:   . Worried  About Programme researcher, broadcasting/film/video in the Last Year: Not on file  . Ran Out of Food in the Last Year: Not on file  Transportation Needs:   . Lack of Transportation (Medical): Not on file  . Lack of Transportation (Non-Medical): Not on file  Physical Activity:   . Days of Exercise per Week: Not on file  . Minutes of Exercise per Session: Not on file  Stress:   . Feeling of Stress : Not on file  Social Connections:   . Frequency of Communication with Friends and Family: Not on file  . Frequency of Social Gatherings with Friends and Family: Not on file  . Attends Religious Services: Not on file  . Active Member of Clubs or Organizations: Not on file  . Attends Banker Meetings: Not on file  . Marital Status: Not on file   Family History  Problem Relation Age of Onset  . Heart disease Father   . Hypertension Brother   . Other Mother        AFIB  . Colon cancer Neg Hx    Allergies  Allergen Reactions  . Protonix [Pantoprazole Sodium] Rash   Prior to Admission medications   Medication Sig Start Date End Date Taking? Authorizing Provider  ALPRAZolam (XANAX) 1 MG tablet TAKE 1 TABLET BY MOUTH AT BEDTIME IF NEEDED FOR SLEEP. 12/05/18  Laurey Morale, MD  benzonatate (TESSALON PERLES) 100 MG capsule Take 1 capsule (100 mg total) by mouth 2 (two) times daily as needed. 02/12/19   Lucretia Kern, DO  doxycycline (VIBRA-TABS) 100 MG tablet Take 1 tablet (100 mg total) by mouth 2 (two) times daily. 02/12/19   Lucretia Kern, DO  HYDROcodone-homatropine (HYDROMET) 5-1.5 MG/5ML syrup Take 5 mLs by mouth every 4 (four) hours as needed. 02/16/19   Laurey Morale, MD  Multiple Vitamins-Minerals (MULTIVITAMIN ADULTS PO) Take by mouth.    [provider]  valACYclovir (VALTREX) 1000 MG tablet Take 1 tablet (1,000 mg total) by mouth daily. Patient taking differently: Take 1,000 mg by mouth as needed.  04/25/18   Megan Salon, MD     Positive ROS: Otherwise negative  All other systems  have been reviewed and were otherwise negative with the exception of those mentioned in the HPI and as above.  Physical Exam: Constitutional: Alert, well-appearing, no acute distress Ears: External ears without lesions or tenderness. Ear canals are clear bilaterally with intact, clear TMs. On hearing screening with a tuning forks she heard symmetric in both ears with good hearing with a 1024 tuning fork in both ears. Nasal: External nose without lesions. Septum is slightly deviated to the left with a left posterior septal spur.. Clear nasal passages. No polyps noted. Both middle meatus regions were clear and patent bilaterally. Oral: Lips and gums without lesions. Tongue and palate mucosa without lesions. Posterior oropharynx clear. Patient is status post tonsillectomy. Indirect laryngoscopy revealed a clear base of tongue vallecula epiglottis and vocal cords. Had mild arytenoid edema but no significant mucus buildup. Neck: No palpable adenopathy or masses Respiratory: Breathing comfortably  Skin: No facial/neck lesions or rash noted.  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 Also suggested regular use of Nasacort 2 sprays each nostril at night. She has used Nasonex in the past but has not used this recently. She will follow-up if she has persistent sinus pressure to schedule CT scan of sinuses if needed.  Radene Journey, MD

## 2019-09-03 NOTE — Progress Notes (Signed)
54 y.o. G0P0000 Divorced White or Caucasian female here for annual exam. Doing well.  Denies vaginal bleeding.  Stopped HRT.  Having some manageable hot flashes.  Had some epigastric pain last fall.  Was on anti-reflux medications for a short while.    Patient's last menstrual period was 07/15/2011.          Sexually active: Yes.    The current method of family planning is post menopausal status.    Exercising: Yes.    excercising Smoker:  no  Health Maintenance: Pap:  01-03-16 neg HPV HR neg History of abnormal Pap:  no MMG:  02-09-2019 category b density birads 1:neg Colonoscopy:  01-15-14 f/u 13yrs BMD:   none TDaP:  2016 Pneumonia vaccine(s):  no Shingrix:   She is considering having this done Hep C testing: neg 2017 Screening Labs: 04/2018   reports that she has never smoked. She has never used smokeless tobacco. She reports current alcohol use of about 9.0 standard drinks of alcohol per week. She reports that she does not use drugs.  Past Medical History:  Diagnosis Date  . Allergy    sees Dr. Lucie Leather  . GERD (gastroesophageal reflux disease)   . Hematuria    asymptomatic  . Plantar fasciitis   . Scoliosis    mild thoracolumnar-per Dr Lesia Hausen note in [facility]    Past Surgical History:  Procedure Laterality Date  . TONSILLECTOMY    . UPPER GI ENDOSCOPY      Current Outpatient Medications  Medication Sig Dispense Refill  . ALPRAZolam (XANAX) 1 MG tablet TAKE 1 TABLET BY MOUTH AT BEDTIME IF NEEDED FOR SLEEP. 30 tablet 5  . Multiple Vitamins-Minerals (MULTIVITAMIN ADULTS PO) Take by mouth.    . valACYclovir (VALTREX) 1000 MG tablet Take 1 tablet (1,000 mg total) by mouth daily. (Patient taking differently: Take 1,000 mg by mouth as needed. ) 30 tablet 1   No current facility-administered medications for this visit.    Family History  Problem Relation Age of Onset  . Heart disease Father   . Hypertension Brother   . Other Mother        AFIB  . Colon cancer  Neg Hx     Review of Systems  Constitutional: Negative.   HENT: Negative.   Eyes: Negative.   Respiratory: Negative.   Cardiovascular: Negative.   Gastrointestinal: Negative.   Endocrine: Negative.   Genitourinary: Negative.   Musculoskeletal: Negative.   Skin: Negative.   Allergic/Immunologic: Negative.   Neurological: Negative.   Hematological: Negative.   Psychiatric/Behavioral: Negative.     Exam:   BP 120/76   Pulse 84   Resp 16   Ht 5' 4.25" (1.632 m)   Wt 150 lb (68 kg)   LMP 07/15/2011   BMI 25.55 kg/m   Height: 5' 4.25" (163.2 cm)  General appearance: alert, cooperative and appears stated age Head: Normocephalic, without obvious abnormality, atraumatic Neck: no adenopathy, supple, symmetrical, trachea midline and thyroid normal to inspection and palpation Lungs: clear to auscultation bilaterally Breasts: normal appearance, no masses or tenderness Heart: regular rate and rhythm Abdomen: soft, non-tender; bowel sounds normal; no masses,  no organomegaly Extremities: extremities normal, atraumatic, no cyanosis or edema Skin: Skin color, texture, turgor normal. No rashes or lesions Lymph nodes: Cervical, supraclavicular, and axillary nodes normal. No abnormal inguinal nodes palpated Neurologic: Grossly normal   Pelvic: External genitalia:  no lesions              Urethra:  normal  appearing urethra with no masses, tenderness or lesions              Bartholins and Skenes: normal                 Vagina: normal appearing vagina with normal color and discharge, no lesions              Cervix: no lesions              Pap taken: No. Bimanual Exam:  Uterus:  normal size, contour, position, consistency, mobility, non-tender              Adnexa: normal adnexa and no mass, fullness, tenderness               Rectovaginal: Confirms               Anus:  normal sphincter tone, no lesions  Chaperone, Cornelia Copa, CMA, was present for exam.  A:  Well Woman with normal  exam PMP, no HRT H/o HSV 2 H/o microscopic hematuria with urology evaluation  P:   Mammogram guidelines reviewed pap smear with neg HR HPV 2017.  Pt aware of 5 year HR HPV guideline change and is fine with this.  Will repeat next year Colonoscopy UTD Lab work done 2020.  Consider doing next year Will call if/when needs Valtrex RF Return annually or prn

## 2019-09-04 ENCOUNTER — Encounter: Payer: Self-pay | Admitting: Obstetrics & Gynecology

## 2019-09-04 ENCOUNTER — Ambulatory Visit: Payer: BC Managed Care – PPO | Admitting: Obstetrics & Gynecology

## 2019-09-04 ENCOUNTER — Other Ambulatory Visit: Payer: Self-pay

## 2019-09-04 VITALS — BP 120/76 | HR 84 | Resp 16 | Ht 64.25 in | Wt 150.0 lb

## 2019-09-04 DIAGNOSIS — Z01419 Encounter for gynecological examination (general) (routine) without abnormal findings: Secondary | ICD-10-CM

## 2019-10-25 ENCOUNTER — Other Ambulatory Visit (INDEPENDENT_AMBULATORY_CARE_PROVIDER_SITE_OTHER): Payer: Self-pay | Admitting: Otolaryngology

## 2020-02-02 ENCOUNTER — Telehealth: Payer: Self-pay | Admitting: Family Medicine

## 2020-02-02 NOTE — Telephone Encounter (Signed)
Patient is having an acid relux flare up and wants Nexium refilled at [facility].  Please call the patient to let her know.

## 2020-02-03 NOTE — Telephone Encounter (Signed)
Pt requesting Nexium which prescribe by Dr. Ezzard Standing. Please advise

## 2020-02-03 NOTE — Telephone Encounter (Signed)
Patient called back and wanted to see if provider could call her something for a cough, please advise. CB is [phone]

## 2020-02-03 NOTE — Telephone Encounter (Signed)
Please advise 

## 2020-02-04 ENCOUNTER — Other Ambulatory Visit: Payer: Self-pay | Admitting: Family Medicine

## 2020-02-09 MED ORDER — ESOMEPRAZOLE MAGNESIUM 40 MG PO CPDR: 40.0000 mg | DELAYED_RELEASE_CAPSULE | Freq: Every day | ORAL | 3 refills | Status: DC

## 2020-02-09 NOTE — Telephone Encounter (Signed)
The cough could be from the reflux. Call in Nexium 40 mg daily, #90 with 3 rf

## 2020-02-09 NOTE — Telephone Encounter (Signed)
Rx done. 

## 2020-03-04 ENCOUNTER — Other Ambulatory Visit: Payer: Self-pay

## 2020-03-04 ENCOUNTER — Ambulatory Visit (INDEPENDENT_AMBULATORY_CARE_PROVIDER_SITE_OTHER): Payer: BC Managed Care – PPO | Admitting: Family Medicine

## 2020-03-04 ENCOUNTER — Encounter: Payer: Self-pay | Admitting: Family Medicine

## 2020-03-04 VITALS — BP 138/80 | HR 82 | Temp 98.2°F | Resp 18 | Wt 154.0 lb

## 2020-03-04 DIAGNOSIS — K602 Anal fissure, unspecified: Secondary | ICD-10-CM | POA: Diagnosis not present

## 2020-03-04 DIAGNOSIS — K219 Gastro-esophageal reflux disease without esophagitis: Secondary | ICD-10-CM | POA: Diagnosis not present

## 2020-03-04 MED ORDER — FAMOTIDINE 40 MG PO TABS: ORAL_TABLET | ORAL | 3 refills | Status: DC

## 2020-03-04 NOTE — Progress Notes (Signed)
   Subjective:    Patient ID: Elizabeth [name], female    DOB: 06-19-1965, 55 y.o.   MRN: [id number]  HPI Here for several issues. First she has been taking Nexium daily for reflux but she is still having some breakthrough symptoms. Also one week ago she passed a hard stool that was a bit painful, and there was a small bit of bright red blood with the stool. Since then her stools have been normal without pain or bleeding, but she feels something is "not right" in the anal area.    Review of Systems  Constitutional: Negative.   Respiratory: Negative.   Gastrointestinal: Positive for anal bleeding and rectal pain.       Objective:   Physical Exam Constitutional:      Appearance: Normal appearance.  Cardiovascular:     Rate and Rhythm: Normal rate and regular rhythm.     Pulses: Normal pulses.     Heart sounds: Normal heart sounds.  Pulmonary:     Effort: Pulmonary effort is normal.     Breath sounds: Normal breath sounds.  Abdominal:     General: Abdomen is flat. Bowel sounds are normal. There is no distension.     Palpations: Abdomen is soft. There is no mass.     Tenderness: There is no abdominal tenderness. There is no guarding or rebound.     Hernia: No hernia is present.  Genitourinary:    Comments: There is a tiny fissure at the anal verge, no active bleeding  Neurological:     Mental Status: She is alert.           Assessment & Plan:  She has a small anal fissure. She will drink lots of water to keep the stools soft. This should heal in the next week. For the GERD she will take Nexium every morning and add Pepcid 40 mg every evening. Gershon Crane, MD

## 2020-03-07 ENCOUNTER — Other Ambulatory Visit: Payer: Self-pay | Admitting: Family Medicine

## 2020-03-08 NOTE — Telephone Encounter (Signed)
Last office visit- 03/04/20 Last refill-12/05/2018

## 2020-08-02 ENCOUNTER — Other Ambulatory Visit: Payer: Self-pay | Admitting: Physical Medicine and Rehabilitation

## 2020-08-02 ENCOUNTER — Other Ambulatory Visit: Payer: Self-pay | Admitting: Radiology

## 2020-08-02 DIAGNOSIS — Z1231 Encounter for screening mammogram for malignant neoplasm of breast: Secondary | ICD-10-CM

## 2020-08-05 ENCOUNTER — Ambulatory Visit
Admission: RE | Admit: 2020-08-05 | Discharge: 2020-08-05 | Disposition: A | Discharge status: Home / Self Care | Payer: BC Managed Care – PPO | Source: Ambulatory Visit | Attending: Physical Medicine and Rehabilitation | Admitting: Physical Medicine and Rehabilitation

## 2020-08-05 ENCOUNTER — Other Ambulatory Visit: Payer: Self-pay

## 2020-08-05 DIAGNOSIS — Z1231 Encounter for screening mammogram for malignant neoplasm of breast: Secondary | ICD-10-CM

## 2020-08-29 ENCOUNTER — Ambulatory Visit (INDEPENDENT_AMBULATORY_CARE_PROVIDER_SITE_OTHER): Payer: BC Managed Care – PPO

## 2020-08-29 ENCOUNTER — Other Ambulatory Visit: Payer: Self-pay

## 2020-08-29 DIAGNOSIS — Z23 Encounter for immunization: Secondary | ICD-10-CM

## 2020-11-21 ENCOUNTER — Other Ambulatory Visit: Payer: Self-pay

## 2020-11-21 ENCOUNTER — Ambulatory Visit (INDEPENDENT_AMBULATORY_CARE_PROVIDER_SITE_OTHER): Payer: BC Managed Care – PPO | Admitting: Obstetrics & Gynecology

## 2020-11-21 ENCOUNTER — Encounter (HOSPITAL_BASED_OUTPATIENT_CLINIC_OR_DEPARTMENT_OTHER): Payer: Self-pay | Admitting: Obstetrics & Gynecology

## 2020-11-21 ENCOUNTER — Other Ambulatory Visit (HOSPITAL_COMMUNITY)
Admission: RE | Admit: 2020-11-21 | Discharge: 2020-11-21 | Disposition: A | Discharge status: Home / Self Care | Payer: BC Managed Care – PPO | Source: Ambulatory Visit | Attending: Obstetrics & Gynecology | Admitting: Obstetrics & Gynecology

## 2020-11-21 VITALS — BP 127/77 | HR 79 | Ht 64.0 in | Wt 152.2 lb

## 2020-11-21 DIAGNOSIS — Z780 Asymptomatic menopausal state: Secondary | ICD-10-CM | POA: Diagnosis not present

## 2020-11-21 DIAGNOSIS — Z01419 Encounter for gynecological examination (general) (routine) without abnormal findings: Secondary | ICD-10-CM | POA: Diagnosis not present

## 2020-11-21 DIAGNOSIS — Z124 Encounter for screening for malignant neoplasm of cervix: Secondary | ICD-10-CM | POA: Insufficient documentation

## 2020-11-21 DIAGNOSIS — N952 Postmenopausal atrophic vaginitis: Secondary | ICD-10-CM

## 2020-11-21 DIAGNOSIS — Z23 Encounter for immunization: Secondary | ICD-10-CM

## 2020-11-21 DIAGNOSIS — B009 Herpesviral infection, unspecified: Secondary | ICD-10-CM | POA: Diagnosis not present

## 2020-11-21 MED ORDER — VALACYCLOVIR HCL 1 G PO TABS: ORAL_TABLET | ORAL | 1 refills | Status: DC

## 2020-11-21 NOTE — Progress Notes (Signed)
55 y.o. G0P0000 Divorced White or Caucasian female here for annual exam.  Doing well.  Denies vaginal bleeding.  Needs her flu shot today.  Patient's last menstrual period was 07/15/2011.          Sexually active: Yes.    The current method of family planning is PMP Exercising: not as much as she should Smoker:  yes  Health Maintenance: Pap:  01/03/2016 Negative History of abnormal Pap:  no MMG:  08/05/2020 Negative Colonoscopy:  01/15/2014, follow up 10 years BMD:   plan around age 89 Screening Labs: plan fasting blood work next year   reports that she has never smoked. She has never used smokeless tobacco. She reports current alcohol use of about 9.0 standard drinks per week. She reports that she does not use drugs.  Past Medical History:  Diagnosis Date   Allergy    sees Dr. Lucie Leather   GERD (gastroesophageal reflux disease)    Hematuria    asymptomatic   Plantar fasciitis    Scoliosis    mild thoracolumnar-per Dr Lesia Hausen note in [facility]    Past Surgical History:  Procedure Laterality Date   TONSILLECTOMY     UPPER GI ENDOSCOPY      Current Outpatient Medications  Medication Sig Dispense Refill   ALPRAZolam (XANAX) 1 MG tablet TAKE 1 TABLET BY MOUTH AT BEDTIME IF NEEDED FOR SLEEP. 30 tablet 5   esomeprazole (NEXIUM) 40 MG capsule Take 1 capsule (40 mg total) by mouth daily. 90 capsule 3   famotidine (PEPCID) 40 MG tablet Take one every evening 90 tablet 3   Multiple Vitamins-Minerals (MULTIVITAMIN ADULTS PO) Take by mouth. (Patient not taking: Reported on 03/04/2020)     valACYclovir (VALTREX) 1000 MG tablet Take 1 tablet (1,000 mg total) by mouth daily. (Patient taking differently: Take 1,000 mg by mouth as needed.) 30 tablet 1   No current facility-administered medications for this visit.    Family History  Problem Relation Age of Onset   Heart disease Father    Hypertension Brother    Other Mother        AFIB   Colon cancer Neg Hx     Review of  Systems  Exam:   LMP 07/15/2011      General appearance: alert, cooperative and appears stated age Head: Normocephalic, without obvious abnormality, atraumatic Neck: no adenopathy, supple, symmetrical, trachea midline and thyroid normal to inspection and palpation Lungs: clear to auscultation bilaterally Breasts: normal appearance, no masses or tenderness Heart: regular rate and rhythm Abdomen: soft, non-tender; bowel sounds normal; no masses,  no organomegaly Extremities: extremities normal, atraumatic, no cyanosis or edema Skin: Skin color, texture, turgor normal. No rashes or lesions Lymph nodes: Cervical, supraclavicular, and axillary nodes normal. No abnormal inguinal nodes palpated Neurologic: Grossly normal   Pelvic: External genitalia:  no lesions              Urethra:  normal appearing urethra with no masses, tenderness or lesions              Bartholins and Skenes: normal                 Vagina: atrophic vaginal tissue present, no discharge, no lesions              Cervix: no lesions              Pap taken: Yes.   Bimanual Exam:  Uterus:  normal size, contour, position, consistency, mobility, non-tender  Adnexa: normal adnexa and no mass, fullness, tenderness               Rectovaginal: Confirms               Anus:  normal sphincter tone, no lesions  Chaperone, Ina Homes, CMA, was present for exam.  Assessment/Plan: 1. Well woman exam with routine gynecological exam - pap and HR HPV obtained today - MMG 08/2020 - colonoscopy 2015.  Follow up 10 years. - BMD will be ordered around age 49 - care gaps reviewed/updated - flu shot given today - declines blood work today.  Will plan this next year with fasting labs  2.  HSV (herpes simplex virus) infection - valACYclovir (VALTREX) 1000 MG tablet; Take 2 tabs and repeat again in 12 hours as needed for fever blisters.  Dispense: 30 tablet; Refill: 1  3. Postmenopausal - no HRT  4. Vaginal  atrophy

## 2020-11-22 LAB — CYTOLOGY - PAP
Comment: NEGATIVE
Diagnosis: NEGATIVE
High risk HPV: NEGATIVE

## 2021-01-03 ENCOUNTER — Ambulatory Visit (INDEPENDENT_AMBULATORY_CARE_PROVIDER_SITE_OTHER): Payer: BC Managed Care – PPO

## 2021-01-03 DIAGNOSIS — Z23 Encounter for immunization: Secondary | ICD-10-CM

## 2021-01-03 NOTE — Progress Notes (Signed)
Elizabeth [name] is a 55 y.o. female presents to the office today for Shingrix #2 injection, per physician's orders.  Shingrix IM was administered right deltoid today. Patient tolerated injection.   [name]

## 2021-02-27 ENCOUNTER — Other Ambulatory Visit: Payer: Self-pay | Admitting: Family Medicine

## 2021-02-27 NOTE — Telephone Encounter (Signed)
On 11/21/20 this medication was discontinued by Dr. Valentina Shaggy.  Last OV- 03/04/20 No future OV scheduled.   Can this patient receive a refill?

## 2021-08-18 ENCOUNTER — Encounter: Payer: Self-pay | Admitting: Family Medicine

## 2021-08-18 ENCOUNTER — Telehealth (INDEPENDENT_AMBULATORY_CARE_PROVIDER_SITE_OTHER): Payer: BC Managed Care – PPO | Admitting: Family Medicine

## 2021-08-18 VITALS — Temp 97.8°F

## 2021-08-18 DIAGNOSIS — J019 Acute sinusitis, unspecified: Secondary | ICD-10-CM

## 2021-08-18 DIAGNOSIS — B009 Herpesviral infection, unspecified: Secondary | ICD-10-CM | POA: Diagnosis not present

## 2021-08-18 MED ORDER — VALACYCLOVIR HCL 1 G PO TABS: ORAL_TABLET | ORAL | 5 refills | Status: DC

## 2021-08-18 MED ORDER — AMOXICILLIN-POT CLAVULANATE 875-125 MG PO TABS: 1.0000 | ORAL_TABLET | Freq: Two times a day (BID) | ORAL | 0 refills | Status: DC

## 2021-08-18 MED ORDER — HYDROCODONE BIT-HOMATROP MBR 5-1.5 MG/5ML PO SOLN: 5.0000 mL | ORAL | 0 refills | Status: DC | PRN

## 2021-08-18 NOTE — Progress Notes (Signed)
   Subjective:    Patient ID: Elizabeth [name], female    DOB: Apr 04, 1965, 56 y.o.   MRN: [id number]  HPI Virtual Visit via Video Note  I connected with the patient on 08/18/21 at  8:30 AM EDT by a video enabled telemedicine application and verified that I am speaking with the correct person using two identifiers.  Location patient: home Location provider:work or home office Persons participating in the virtual visit: patient, provider  I discussed the limitations of evaluation and management by telemedicine and the availability of in person appointments. The patient expressed understanding and agreed to proceed.   HPI: Here for 4 days of fever to 99.2 degrees, stuffy head, PND, and a dry cough. Taking Mucinex and Tylenol.    ROS: See pertinent positives and negatives per HPI.  Past Medical History:  Diagnosis Date   Allergy    sees Dr. Lucie Leather   GERD (gastroesophageal reflux disease)    Hematuria    asymptomatic   Plantar fasciitis    Scoliosis    mild thoracolumnar-per Dr Lesia Hausen note in [facility]    Past Surgical History:  Procedure Laterality Date   TONSILLECTOMY     UPPER GI ENDOSCOPY      Family History  Problem Relation Age of Onset   Heart disease Father    Hypertension Brother    Other Mother        AFIB   Colon cancer Neg Hx      Current Outpatient Medications:    ALPRAZolam (XANAX) 1 MG tablet, TAKE 1 TABLET BY MOUTH AT BEDTIME IF NEEDED FOR SLEEP., Disp: 30 tablet, Rfl: 5   amoxicillin-clavulanate (AUGMENTIN) 875-125 MG tablet, Take 1 tablet by mouth 2 (two) times daily., Disp: 20 tablet, Rfl: 0   esomeprazole (NEXIUM) 40 MG capsule, TAKE (1) CAPSULE DAILY., Disp: 90 capsule, Rfl: 3   HYDROcodone bit-homatropine (HYCODAN) 5-1.5 MG/5ML syrup, Take 5 mLs by mouth every 4 (four) hours as needed for cough., Disp: 240 mL, Rfl: 0   valACYclovir (VALTREX) 1000 MG tablet, Take 2 tabs and repeat again in 12 hours as needed for fever blisters., Disp: 30  tablet, Rfl: 5  EXAM:  VITALS per patient if applicable:  GENERAL: alert, oriented, appears well and in no acute distress  HEENT: atraumatic, conjunttiva clear, no obvious abnormalities on inspection of external nose and ears  NECK: normal movements of the head and neck  LUNGS: on inspection no signs of respiratory distress, breathing rate appears normal, no obvious gross SOB, gasping or wheezing  CV: no obvious cyanosis  MS: moves all visible extremities without noticeable abnormality  PSYCH/NEURO: pleasant and cooperative, no obvious depression or anxiety, speech and thought processing grossly intact  ASSESSMENT AND PLAN: Sinusitis, treat with 10 days of Augmentin. Gershon Crane, MD  Discussed the following assessment and plan:  HSV (herpes simplex virus) infection - Plan: valACYclovir (VALTREX) 1000 MG tablet     I discussed the assessment and treatment plan with the patient. The patient was provided an opportunity to ask questions and all were answered. The patient agreed with the plan and demonstrated an understanding of the instructions.   The patient was advised to call back or seek an in-person evaluation if the symptoms worsen or if the condition fails to improve as anticipated.      Review of Systems     Objective:   Physical Exam        Assessment & Plan:

## 2021-08-31 ENCOUNTER — Telehealth: Payer: Self-pay | Admitting: Family Medicine

## 2021-08-31 MED ORDER — HYDROCODONE BIT-HOMATROP MBR 5-1.5 MG/5ML PO SOLN: 5.0000 mL | ORAL | 0 refills | Status: DC | PRN

## 2021-08-31 MED ORDER — LEVOFLOXACIN 500 MG PO TABS: 500.0000 mg | ORAL_TABLET | Freq: Every day | ORAL | 0 refills | Status: AC

## 2021-08-31 NOTE — Telephone Encounter (Signed)
I sent in Levaquin and more cough syrup

## 2021-08-31 NOTE — Telephone Encounter (Signed)
Left pt a detailed message advising pt to pick up Rx from his pharmacy

## 2021-08-31 NOTE — Telephone Encounter (Signed)
Pt states she is still experiencing symptoms and requesting a call or more medication. Requesting an antibiotic and refill HYDROcodone bit-homatropine (HYCODAN) 5-1.5 MG/5ML syrup  [facility] [place], [place] - [address] [place] Phone:  [phone]  Fax:  [phone]

## 2022-02-20 ENCOUNTER — Other Ambulatory Visit: Payer: Self-pay | Admitting: Obstetrics & Gynecology

## 2022-02-20 DIAGNOSIS — Z1231 Encounter for screening mammogram for malignant neoplasm of breast: Secondary | ICD-10-CM

## 2022-03-02 ENCOUNTER — Ambulatory Visit
Admission: RE | Admit: 2022-03-02 | Discharge: 2022-03-02 | Disposition: A | Discharge status: Home / Self Care | Payer: BC Managed Care – PPO | Source: Ambulatory Visit | Attending: Obstetrics & Gynecology | Admitting: Obstetrics & Gynecology

## 2022-03-02 DIAGNOSIS — Z1231 Encounter for screening mammogram for malignant neoplasm of breast: Secondary | ICD-10-CM

## 2022-03-20 ENCOUNTER — Telehealth (INDEPENDENT_AMBULATORY_CARE_PROVIDER_SITE_OTHER): Payer: BC Managed Care – PPO | Admitting: Family Medicine

## 2022-03-20 ENCOUNTER — Encounter: Payer: Self-pay | Admitting: Family Medicine

## 2022-03-20 DIAGNOSIS — J019 Acute sinusitis, unspecified: Secondary | ICD-10-CM

## 2022-03-20 MED ORDER — LEVOFLOXACIN 500 MG PO TABS: 500.0000 mg | ORAL_TABLET | Freq: Every day | ORAL | 0 refills | Status: DC

## 2022-03-20 MED ORDER — HYDROCODONE BIT-HOMATROP MBR 5-1.5 MG/5ML PO SOLN: 5.0000 mL | ORAL | 0 refills | Status: DC | PRN

## 2022-03-20 NOTE — Progress Notes (Signed)
   Subjective:    Patient ID: Elizabeth [name], female    DOB: [birth year], 57 y.o.   MRN: [id number]  HPI Virtual Visit via Video Note  I connected with the patient on 03/20/22 at  9:15 AM EST by a video enabled telemedicine application and verified that I am speaking with the correct person using two identifiers.  Location patient: home Location provider:work or home office Persons participating in the virtual visit: patient, provider  I discussed the limitations of evaluation and management by telemedicine and the availability of in person appointments. The patient expressed understanding and agreed to proceed.   HPI:    ROS: See pertinent positives and negatives per HPI.  Past Medical History:  Diagnosis Date   Allergy    sees Dr. Neldon Mc   GERD (gastroesophageal reflux disease)    Hematuria    asymptomatic   Plantar fasciitis    Scoliosis    mild thoracolumnar-per Dr Inda Merlin note in [facility]    Past Surgical History:  Procedure Laterality Date   TONSILLECTOMY     UPPER GI ENDOSCOPY      Family History  Problem Relation Age of Onset   Heart disease Father    Hypertension Brother    Other Mother        AFIB   Colon cancer Neg Hx      Current Outpatient Medications:    ALPRAZolam (XANAX) 1 MG tablet, TAKE 1 TABLET BY MOUTH AT BEDTIME IF NEEDED FOR SLEEP., Disp: 30 tablet, Rfl: 5   esomeprazole (NEXIUM) 40 MG capsule, TAKE (1) CAPSULE DAILY., Disp: 90 capsule, Rfl: 3   valACYclovir (VALTREX) 1000 MG tablet, Take 2 tabs and repeat again in 12 hours as needed for fever blisters., Disp: 30 tablet, Rfl: 5  EXAM:  VITALS per patient if applicable:  GENERAL: alert, oriented, appears well and in no acute distress  HEENT: atraumatic, conjunttiva clear, no obvious abnormalities on inspection of external nose and ears  NECK: normal movements of the head and neck  LUNGS: on inspection no signs of respiratory distress, breathing rate appears normal, no obvious  gross SOB, gasping or wheezing  CV: no obvious cyanosis  MS: moves all visible extremities without noticeable abnormality  PSYCH/NEURO: pleasant and cooperative, no obvious depression or anxiety, speech and thought processing grossly intact  ASSESSMENT AND PLAN:  Discussed the following assessment and plan:  No diagnosis found.     I discussed the assessment and treatment plan with the patient. The patient was provided an opportunity to ask questions and all were answered. The patient agreed with the plan and demonstrated an understanding of the instructions.   The patient was advised to call back or seek an in-person evaluation if the symptoms worsen or if the condition fails to improve as anticipated.      Review of Systems     Objective:   Physical Exam        Assessment & Plan:

## 2022-03-23 ENCOUNTER — Telehealth: Payer: Self-pay | Admitting: Family Medicine

## 2022-03-23 NOTE — Telephone Encounter (Signed)
She is only on Day 4 of the Levaquin. Keep taking this and call back on Monday if not better

## 2022-03-23 NOTE — Telephone Encounter (Signed)
Pt was sent to triage nurse and was sent back to office. Pt had virtual appt with dr fry on 03-20-2022 for sinus infection and pt is no better and would like to know if she should be on a different abx.  [place], [place] Phone: [phone]  Fax: [phone]

## 2022-03-23 NOTE — Telephone Encounter (Signed)
Patient is currently prescribed Levaquin 545m

## 2022-03-26 ENCOUNTER — Other Ambulatory Visit: Payer: Self-pay

## 2022-03-26 DIAGNOSIS — J019 Acute sinusitis, unspecified: Secondary | ICD-10-CM

## 2022-03-26 MED ORDER — DOXYCYCLINE HYCLATE 100 MG PO TABS: 100.0000 mg | ORAL_TABLET | Freq: Two times a day (BID) | ORAL | 0 refills | Status: DC

## 2022-03-26 NOTE — Telephone Encounter (Signed)
Spoke with patient informed to stop Levaquin. Doxycycline 100 mg sent to [facility].

## 2022-03-26 NOTE — Telephone Encounter (Signed)
Pt is calling back and she is not better. Please advise

## 2022-03-26 NOTE — Telephone Encounter (Signed)
Stop the Levaquin. Instead call in Doxycycline 100 mg BID for 10 days

## 2022-04-09 ENCOUNTER — Encounter: Payer: Self-pay | Admitting: Family Medicine

## 2022-04-09 ENCOUNTER — Ambulatory Visit (INDEPENDENT_AMBULATORY_CARE_PROVIDER_SITE_OTHER): Payer: BC Managed Care – PPO | Admitting: Family Medicine

## 2022-04-09 VITALS — BP 118/82 | HR 100 | Temp 98.3°F | Wt 155.0 lb

## 2022-04-09 DIAGNOSIS — J0191 Acute recurrent sinusitis, unspecified: Secondary | ICD-10-CM

## 2022-04-09 DIAGNOSIS — R509 Fever, unspecified: Secondary | ICD-10-CM

## 2022-04-09 MED ORDER — CLARITHROMYCIN 500 MG PO TABS: 500.0000 mg | ORAL_TABLET | Freq: Two times a day (BID) | ORAL | 0 refills | Status: DC

## 2022-04-09 MED ORDER — METHYLPREDNISOLONE 4 MG PO TBPK: ORAL_TABLET | ORAL | 0 refills | Status: DC

## 2022-04-09 MED ORDER — ALPRAZOLAM 1 MG PO TABS: ORAL_TABLET | ORAL | 5 refills | Status: AC

## 2022-04-09 MED ORDER — ESOMEPRAZOLE MAGNESIUM 40 MG PO CPDR: DELAYED_RELEASE_CAPSULE | ORAL | 3 refills | Status: DC

## 2022-04-09 NOTE — Progress Notes (Unsigned)
   Subjective:    Patient ID: Elizabeth [name], female    DOB: [birth year], 57 y.o.   MRN: [id number]  HPI Here for continuing symptoms of sinus congestion, PND, and a dry cough. This started about 4 weeks ago. She had a virtual visit with [place] on 03-20-22 and we felt this was a sinus infection. We started her on a course of Levaquin. However she did not improve so on 03-26-22 we gave her a course of Doxycycline. This did not help either, and then she developed some new symptoms 4 days ago. Now she has had fevers to 101.5 degrees and body aches on top of the sinus congestion and cough. No ST or NVD. Drinking fluids and taking Tylenol.    Review of Systems  Constitutional:  Positive for fatigue and fever.  HENT:  Positive for congestion, postnasal drip and sinus pressure. Negative for ear pain and sore throat.   Eyes: Negative.   Respiratory:  Positive for cough. Negative for shortness of breath and wheezing.   She tests negative for Covid and influenza today.      Objective:   Physical Exam Constitutional:      Appearance: Normal appearance.     Comments: She coughs occasionally   HENT:     Right Ear: Tympanic membrane, ear canal and external ear normal.     Left Ear: Tympanic membrane, ear canal and external ear normal.     Nose: Nose normal.     Mouth/Throat:     Pharynx: Oropharynx is clear.  Eyes:     Conjunctiva/sclera: Conjunctivae normal.  Pulmonary:     Effort: Pulmonary effort is normal.     Breath sounds: Normal breath sounds.  Lymphadenopathy:     Cervical: No cervical adenopathy.  Neurological:     Mental Status: She is alert.           Assessment & Plan:  Partially treated sinusitis. We will treat her with 10 days of Biaxin and a Medrol dose pack. Recheck as needed.  Alysia Penna, MD

## 2022-04-10 LAB — POCT INFLUENZA A/B
Influenza A, POC: NEGATIVE
Influenza B, POC: NEGATIVE

## 2022-04-10 LAB — POC COVID19 BINAXNOW: SARS Coronavirus 2 Ag: NEGATIVE

## 2022-04-13 ENCOUNTER — Other Ambulatory Visit: Payer: Self-pay

## 2022-05-18 ENCOUNTER — Ambulatory Visit: Payer: BC Managed Care – PPO | Admitting: Family Medicine

## 2022-05-18 ENCOUNTER — Encounter: Payer: Self-pay | Admitting: Family Medicine

## 2022-05-18 VITALS — BP 128/80 | HR 96 | Temp 98.1°F | Wt 153.4 lb

## 2022-05-18 DIAGNOSIS — L905 Scar conditions and fibrosis of skin: Secondary | ICD-10-CM | POA: Diagnosis not present

## 2022-05-18 NOTE — Progress Notes (Signed)
   Subjective:    Patient ID: Elizabeth [name], female    DOB: Jul 04, 1965, 57 y.o.   MRN: [id number]  HPI Here for sn issue with her right 3rd finger. About 2 months ago she was lifting a TV when the back of it fell off, exposing a sharp edge. This caused a laceration down the length of the finger. As it healed, it formed scar tissue that does not allow her to fully extend the finger.    Review of Systems  Constitutional: Negative.   Respiratory: Negative.    Cardiovascular: Negative.        Objective:   Physical Exam Constitutional:      Appearance: Normal appearance.  Cardiovascular:     Rate and Rhythm: Normal rate and regular rhythm.     Pulses: Normal pulses.     Heart sounds: Normal heart sounds.  Pulmonary:     Effort: Pulmonary effort is normal.     Breath sounds: Normal breath sounds.  Musculoskeletal:     Comments: There is a linear scar down the entire length of the flexor side of the right 3rd finger. This is not tender. Flexion is full, but extension is limited due to skin tightness.   Neurological:     Mental Status: She is alert.           Assessment & Plan:  Scar tissue on the finger. We will refer her to Hand Surgery to repair this.  Gershon Crane, MD

## 2022-07-04 ENCOUNTER — Ambulatory Visit: Payer: BC Managed Care – PPO | Admitting: Medical

## 2022-12-05 ENCOUNTER — Ambulatory Visit: Payer: BC Managed Care – PPO

## 2022-12-05 ENCOUNTER — Encounter: Payer: Self-pay | Admitting: Family Medicine

## 2022-12-05 ENCOUNTER — Ambulatory Visit: Payer: BC Managed Care – PPO | Admitting: Family Medicine

## 2022-12-05 VITALS — BP 122/80 | HR 89 | Temp 99.2°F | Wt 158.2 lb

## 2022-12-05 DIAGNOSIS — R053 Chronic cough: Secondary | ICD-10-CM

## 2022-12-05 DIAGNOSIS — J329 Chronic sinusitis, unspecified: Secondary | ICD-10-CM

## 2022-12-05 LAB — POC COVID19 BINAXNOW: SARS Coronavirus 2 Ag: NEGATIVE

## 2022-12-05 MED ORDER — CLARITHROMYCIN 500 MG PO TABS: 500.0000 mg | ORAL_TABLET | Freq: Two times a day (BID) | ORAL | 0 refills | Status: DC

## 2022-12-05 MED ORDER — MOMETASONE FUROATE 50 MCG/ACT NA SUSP: 2.0000 | Freq: Every day | NASAL | 12 refills | Status: DC

## 2022-12-05 MED ORDER — METHYLPREDNISOLONE 4 MG PO TBPK: ORAL_TABLET | ORAL | 0 refills | Status: DC

## 2022-12-05 MED ORDER — HYDROCODONE BIT-HOMATROP MBR 5-1.5 MG/5ML PO SOLN: 5.0000 mL | ORAL | 0 refills | Status: DC | PRN

## 2022-12-05 NOTE — Progress Notes (Signed)
   Subjective:    Patient ID: [name], female    DOB: November 16, 1965, 57 y.o.   MRN: [id number]  HPI Here for 2 issues. First she thinks she has another sinus infection. One week ago she developed fever, sinus pressure, blowing green mucus from the nose, and a dry cough. She is taking Theraflu, Sudafed, and saline nasal sprays. The other concern is her chronic cough. She has had a dry cough on and off for the past year. She has always thought it was caused by PND. Her coworker recently expressed concern about her coughing.    Review of Systems  Constitutional:  Positive for fever.  HENT:  Positive for congestion, postnasal drip and sinus pressure. Negative for ear pain and sore throat.   Eyes: Negative.   Respiratory:  Positive for cough. Negative for shortness of breath and wheezing.        Objective:   Physical Exam Constitutional:      Appearance: Normal appearance.  HENT:     Right Ear: Tympanic membrane, ear canal and external ear normal.     Left Ear: Tympanic membrane, ear canal and external ear normal.     Nose: Nose normal.     Mouth/Throat:     Pharynx: Oropharynx is clear.  Eyes:     Conjunctiva/sclera: Conjunctivae normal.  Pulmonary:     Effort: Pulmonary effort is normal.     Breath sounds: Normal breath sounds.  Lymphadenopathy:     Cervical: No cervical adenopathy.  Neurological:     Mental Status: She is alert.           Assessment & Plan:  She has a recurrent sinus infection, so we will treat with 10 days of Biaxin and a Medrol dose pack. We will refer her to ENT for the recurrent sinus infections. We will get a CXR today for the chronic cough.  Gershon Crane, MD

## 2022-12-14 ENCOUNTER — Telehealth: Payer: Self-pay | Admitting: Family Medicine

## 2022-12-14 NOTE — Telephone Encounter (Signed)
Pt was seen on 12/05/22 for this

## 2022-12-14 NOTE — Telephone Encounter (Signed)
Patient called and would like a new antibiotic for her sinuses,  clarithromycin (BIAXIN) 500 MG tablet is not working.  Patient states not getting better, still feels pressure in her head.  Please advise at [phone]

## 2022-12-17 NOTE — Telephone Encounter (Signed)
Call in Augmentin 875 BID for 10 days  

## 2022-12-18 ENCOUNTER — Other Ambulatory Visit: Payer: Self-pay

## 2022-12-18 MED ORDER — AMOXICILLIN-POT CLAVULANATE 875-125 MG PO TABS: 1.0000 | ORAL_TABLET | Freq: Two times a day (BID) | ORAL | 0 refills | Status: DC

## 2022-12-18 NOTE — Telephone Encounter (Signed)
 Pt Rx sent and pt notified via MyChart

## 2023-01-21 ENCOUNTER — Telehealth: Payer: Self-pay | Admitting: Family Medicine

## 2023-01-21 MED ORDER — CLARITHROMYCIN 500 MG PO TABS: 500.0000 mg | ORAL_TABLET | Freq: Two times a day (BID) | ORAL | 0 refills | Status: DC

## 2023-01-21 NOTE — Telephone Encounter (Signed)
Prescription Request  01/21/2023  LOV: 12/05/2022  What is the name of the medication or equipment? clarithromycin clarithromycin (BIAXIN) 500 MG tablet  Have you contacted your pharmacy to request a refill? No   Which pharmacy would you like this sent to?  [facility] [place], [place] - [address] [place] [address]  Phone: [phone]  Patient notified that their request is being sent to the clinical staff for review and that they should receive a response within 2 business days.   Please advise at Mobile [phone] (mobile)

## 2023-01-21 NOTE — Telephone Encounter (Signed)
Done

## 2023-01-23 ENCOUNTER — Other Ambulatory Visit: Payer: Self-pay | Admitting: Family Medicine

## 2023-01-23 MED ORDER — HYDROCODONE BIT-HOMATROP MBR 5-1.5 MG/5ML PO SOLN: 5.0000 mL | ORAL | 0 refills | Status: DC | PRN

## 2023-01-23 NOTE — Telephone Encounter (Signed)
Pt notified via My Chart

## 2023-01-23 NOTE — Telephone Encounter (Signed)
Copied from CRM [id number]. Topic: Clinical - Medication Refill >> Jan 23, 2023  9:03 AM Deaijah H wrote: Most Recent Primary Care Visit:  Provider: Gershon Crane A  Department: [facility]  Visit Type: OFFICE VISIT  Date: 12/05/2022  Medication: HYDROcodone bit-homatropine (HYCODAN) 5-1.5 MG/5ML syrup  Has the patient contacted their pharmacy? No (Agent: If no, request that the patient contact the pharmacy for the refill. If patient does not wish to contact the pharmacy document the reason why and proceed with request.) (Agent: If yes, when and what did the pharmacy advise?) Due to Dr, [name] doing one at a time  Is this the correct pharmacy for this prescription? Yes If no, delete pharmacy and type the correct one.  This is the patient's preferred pharmacy:  [facility] [place], [place] - [address] [place] [address] Phone: [phone] Fax: [phone]   Has the prescription been filled recently? Yes  Is the patient out of the medication? Yes  Has the patient been seen for an appointment in the last year OR does the patient have an upcoming appointment? Yes  Can we respond through MyChart? Yes  Agent: Please be advised that Rx refills may take up to 3 business days. We ask that you follow-up with your pharmacy.

## 2023-02-18 ENCOUNTER — Other Ambulatory Visit: Payer: Self-pay | Admitting: Family Medicine

## 2023-02-18 NOTE — Telephone Encounter (Signed)
 Copied from CRM [id number]. Topic: Clinical - Medication Question >> Feb 18, 2023  9:52 AM Orinda Kenner C wrote: Reason for CRM: ***

## 2023-02-20 ENCOUNTER — Other Ambulatory Visit: Payer: Self-pay | Admitting: Family Medicine

## 2023-02-20 ENCOUNTER — Ambulatory Visit: Payer: Self-pay | Admitting: Family Medicine

## 2023-02-20 MED ORDER — HYDROCODONE BIT-HOMATROP MBR 5-1.5 MG/5ML PO SOLN: 5.0000 mL | ORAL | 0 refills | Status: DC | PRN

## 2023-02-20 MED ORDER — CLARITHROMYCIN 500 MG PO TABS: 500.0000 mg | ORAL_TABLET | Freq: Two times a day (BID) | ORAL | 0 refills | Status: DC

## 2023-02-20 NOTE — Telephone Encounter (Signed)
 Copied from CRM [id number]. Topic: Clinical - Medication Refill >> Feb 20, 2023 10:02 AM Artemio Larry wrote: Most Recent Primary Care Visit:  Provider: Corita Diego A  Department: [facility]  Visit Type: OFFICE VISIT  Date: 12/05/2022  Medication: ***  Has the patient contacted their pharmacy?  (Agent: If no, request that the patient contact the pharmacy for the refill. If patient does not wish to contact the pharmacy document the reason why and proceed with request.) (Agent: If yes, when and what did the pharmacy advise?)  Is this the correct pharmacy for this prescription?  If no, delete pharmacy and type the correct one.  This is the patient's preferred pharmacy:  [facility] [place], [place] - [address] [place] [address] Phone: [phone] Fax: [phone]  [facility] [address] [place], [place] - [address] [place] [address] Phone: [phone] Fax: [phone]  [id number] [place], [place] - [address] [place] [address] Phone: [phone] Fax: [phone]   Has the prescription been filled recently?   Is the patient out of the medication?   Has the patient been seen for an appointment in the last year OR does the patient have an upcoming appointment?   Can we respond through MyChart?   Agent: Please be advised that Rx refills may take up to 3 business days. We ask that you follow-up with your pharmacy.

## 2023-02-20 NOTE — Telephone Encounter (Signed)
Copied from CRM [id number]. Topic: Clinical - Medication Refill >> Feb 20, 2023 10:02 AM Drema Balzarine wrote: Most Recent Primary Care Visit:  Provider: Gershon Crane A  Department: [facility]  Visit Type: OFFICE VISIT  Date: 12/05/2022  Medication: HYDROcodone bit-homatropine (HYCODAN) 5-1.5 MG/5ML syrup  Has the patient contacted their pharmacy? Yes (Agent: If no, request that the patient contact the pharmacy for the refill. If patient does not wish to contact the pharmacy document the reason why and proceed with request.) (Agent: If yes, when and what did the pharmacy advise?)  Is this the correct pharmacy for this prescription? Yes If no, delete pharmacy and type the correct one.  This is the patient's preferred pharmacy:   [facility] [place], [place] - [address] [place] [address] Phone: [phone] Fax: [phone]    Has the prescription been filled recently? Yes  Is the patient out of the medication? YES, PATIENT HASN'T BEEN ABLE TO SLEEP *3RD REQUEST*  Has the patient been seen for an appointment in the last year OR does the patient have an upcoming appointment? Yes  Can we respond through MyChart? No  Agent: Please be advised that Rx refills may take up to 3 business days. We ask that you follow-up with your pharmacy.

## 2023-02-20 NOTE — Telephone Encounter (Signed)
 Pt LOV was on 12/05/22 for Recurrent Sinusitis. Please advise

## 2023-02-20 NOTE — Telephone Encounter (Signed)
 Pt notified- verbalized understanding.

## 2023-02-20 NOTE — Telephone Encounter (Signed)
 Copied from CRM [id number]. Topic: Clinical - Medication Question >> Feb 20, 2023 10:44 AM Crispin Dolphin wrote: Reason for CRM: Patient called back in. States she was disconnected. She wanted to request refill for cough medicine which has been requested and stated she now thinks she has sinus infection as well and is requesting provider also send in last antibiotic he prescribed for her. Thank You   Chief Complaint: green nasal mucus Symptoms: dry cough, green nasal mucus, fever yesterday 99.9 F day before was 100.8, prior vomiting, congestion for 5 days, some pressure in cheeks Frequency: continual Pertinent Negatives: Patient denies sinus pain, headache Disposition: [] 911 / [] ED /[] Urgent Care (no appt availability in office) / [] Appointment(In office/virtual)/ []  [place] [facility]/ [] Home Care/ [x] Refused Recommended Disposition /[] [place] [facility]/ []  Follow-up with PCP Additional Notes: Pt reporting hx of frequent sinus infections, pt was seen by ENT recently but no sinus infection symptoms at that time, now been having nasal congestion for 5 days as well as low-grade fever, prior vomiting. Pt reporting new symptom of "green" nasal discharge and "some pressure in cheeks." Pt confirms no trouble breathing, can breathe through nose after blowing it, "don't think" fever today, pt also coughing frequently with dry cough. Pt confirms no sore throat, SOB, or earache, no pain to sinuses or head. Pt reporting pt to core muscles from coughing. Advised per protocol for pt to be examined within 3 days, pt refusing in-person appt, requesting med be sent in since was seen recently for sinus infection 2x in past couple months or requesting virtual appt with Dr. Alyne Babinski. No availability with PCP. Sending message to PCP office - please advise. Pt verbalized understanding to call back if worsening or new symptoms like severe pain or trouble breathing.  Reason for Disposition  Lots of coughing  Answer  Assessment - Initial Assessment Questions 4. RECURRENT SYMPTOM: "Have you ever had sinus problems before?" If Yes, ask: "When was the last time?" and "What happened that time?"      yes 5. NASAL CONGESTION: "Is the nose blocked?" If Yes, ask: "Can you open it or must you breathe through your mouth?"     Breathe through my nose but have to blow it 6. NASAL DISCHARGE: "Do you have discharge from your nose?" If so ask, "What color?"     Green 7. FEVER: "Do you have a fever?" If Yes, ask: "What is it, how was it measured, and when did it start?"      Fever yesterday but don't think today 8. OTHER SYMPTOMS: "Do you have any other symptoms?" (e.g., sore throat, cough, earache, difficulty breathing)     Prior vomiting. Coughing, not coughing anything up. No sore throat, earache, difficulty breathing  Protocols used: Sinus Pain or Congestion-A-AH

## 2023-02-20 NOTE — Addendum Note (Signed)
 Addended by: Corita Diego A on: 02/20/2023 05:23 PM   Modules accepted: Orders

## 2023-02-20 NOTE — Telephone Encounter (Signed)
I sent both of these in

## 2023-02-21 NOTE — Telephone Encounter (Signed)
Rx already sent.

## 2023-05-10 ENCOUNTER — Telehealth (INDEPENDENT_AMBULATORY_CARE_PROVIDER_SITE_OTHER): Payer: Self-pay | Admitting: Family Medicine

## 2023-05-10 ENCOUNTER — Ambulatory Visit: Payer: Self-pay

## 2023-05-10 ENCOUNTER — Encounter: Payer: Self-pay | Admitting: Family Medicine

## 2023-05-10 VITALS — Ht 64.0 in | Wt 150.0 lb

## 2023-05-10 DIAGNOSIS — R21 Rash and other nonspecific skin eruption: Secondary | ICD-10-CM | POA: Diagnosis not present

## 2023-05-10 MED ORDER — TRIAMCINOLONE ACETONIDE 0.5 % EX OINT: 1.0000 | TOPICAL_OINTMENT | Freq: Three times a day (TID) | CUTANEOUS | 0 refills | Status: AC

## 2023-05-10 MED ORDER — PREDNISONE 20 MG PO TABS: 20.0000 mg | ORAL_TABLET | Freq: Every day | ORAL | 0 refills | Status: DC

## 2023-05-10 NOTE — Telephone Encounter (Signed)
  Chief Complaint: red spot clustered, and one half dollar sized area to the R wrist-pt thinks may be poison ivy Symptoms: very itchy, no pain Frequency: about 2 days Pertinent Negatives: Patient denies fever, denies drainage, denies it looking infected Disposition: [] ED /[] Urgent Care (no appt availability in office) / [x] Appointment(In office/virtual)/ []  [place] [facility]/ [] Home Care/ [] Refused Recommended Disposition /[] [place] [facility]/ []  Follow-up with PCP Additional Notes: Pt states that she was burning the other day and was in the weeds. Pt states that she now has 2 spot on the R arm. One is half dollar sized, the other is a cluster of 5 red dots. Pt states that she feels she may have poison ivy or was bitten by a spider. Pt denies fever. Pt sched for VV.  Copied from CRM [id number]. Topic: Clinical - Red Word Triage >> May 10, 2023 11:12 AM Elizebeth Brooking wrote: [facility] that prompted transfer to Nurse Triage: Patient called in stating she isnt sure if she was bit by a spider or got in some poison ivy but she had whelp marks on arm Reason for Disposition  [1] Looks infected (spreading redness, pus) AND [2] large red area (> 2 in. or 5 cm)  Answer Assessment - Initial Assessment Questions 1. APPEARANCE of RASH: "Describe the rash."      5 little dots that are really read, then about half dollar sized area to my hand  2. LOCATION: "Where is the rash located?"      R arm/hand 3. NUMBER: "How many spots are there?"      numerous 4. SIZE: "How big are the spots?" (Inches, centimeters or compare to size of a coin)      Half dollar for one spot and then numerous little dots 5. ONSET: "When did the rash start?"      2 days 6. ITCHING: "Does the rash itch?" If Yes, ask: "How bad is the itch?"  (Scale 0-10; or none, mild, moderate, severe)     severe 7. PAIN: "Does the rash hurt?" If Yes, ask: "How bad is the pain?"  (Scale 0-10; or none, mild, moderate, severe)    - NONE (0): no pain     - MILD (1-3): doesn't interfere with normal activities     - MODERATE (4-7): interferes with normal activities or awakens from sleep     - SEVERE (8-10): excruciating pain, unable to do any normal activities     denies 8. OTHER SYMPTOMS: "Do you have any other symptoms?" (e.g., fever)     Denies, but states puffy and red  Protocols used: Rash or Redness - Localized-A-AH

## 2023-05-10 NOTE — Progress Notes (Signed)
   Elizabeth [name] is a 58 y.o. female who presents today for a virtual office visit.  Assessment/Plan:  Rash Discussed limitations of virtual visit.  Appearance consistent with contact dermatitis.  Also possibly insect bite however this would be atypical for her current distribution.  We will treat presumed contact dermatitis with topical triamcinolone.  Will also send a pocket prescription for prednisone with instruction to start unless symptoms fail to improve with topical triamcinolone.  Discussed that it may take 2 to 3 weeks for this to fully resolve if this is contact dermatitis.  We discussed reasons to return to care.  She will follow-up as needed.     Subjective:  HPI:  Patient here with rash on right arm. This started a couple days ago.  She was outside doing yard work and believes that she may have been exposed to poison ivy or poison oak.  She is also concerned about potential insect bite.  Initially located on right arm.  She has had some spreading as well.  Few lesions on her back as well.  Area is very pruritic.  Topical Benadryl has not given much benefit.        Objective/Observations  Physical Exam: Gen: NAD, resting comfortably Pulm: Normal work of breathing Neuro: Grossly normal, moves all extremities Skin: Erythematous rash on volar aspect of right wrist with a few scattered vesicles. Psych: Normal affect and thought content  Virtual Visit via Video   I connected with Elizabeth [name] on 05/10/23 at  1:20 PM EDT by a video enabled telemedicine application and verified that I am speaking with the correct person using two identifiers. The limitations of evaluation and management by telemedicine and the availability of in person appointments were discussed. The patient expressed understanding and agreed to proceed.   Patient location: Home Provider location: [place] [facility] [employer] Persons participating in the virtual visit: Myself and Patient     Katina Degree.  Jimmey Ralph, MD 05/10/2023 1:43 PM

## 2023-06-05 IMAGING — MG DIGITAL SCREENING BILAT W/ CAD
5 series · 5 of 5 positions shown · non-contrast
Comparison: Previous exam(s).

CLINICAL DATA: Screening.

EXAM:
DIGITAL SCREENING BILATERAL MAMMOGRAM WITH CAD
TECHNIQUE: Bilateral screening digital craniocaudal and mediolateral oblique
mammograms were obtained. The images were evaluated with
computer-aided detection.

[R CC]
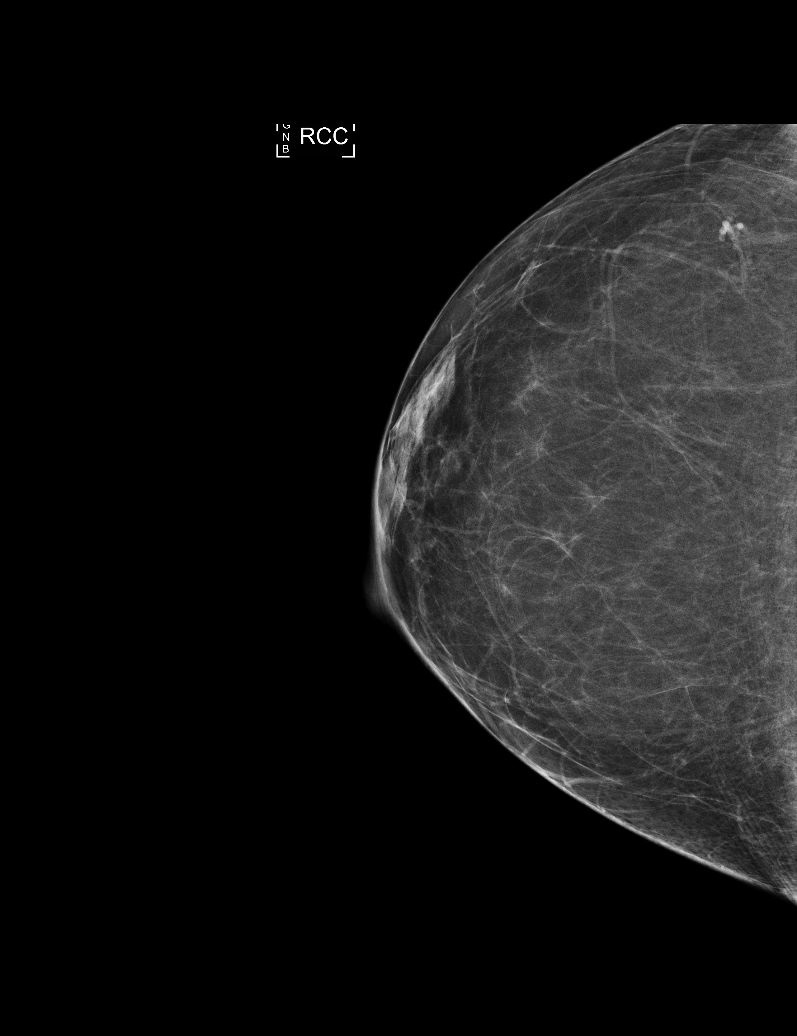

[R MLO]
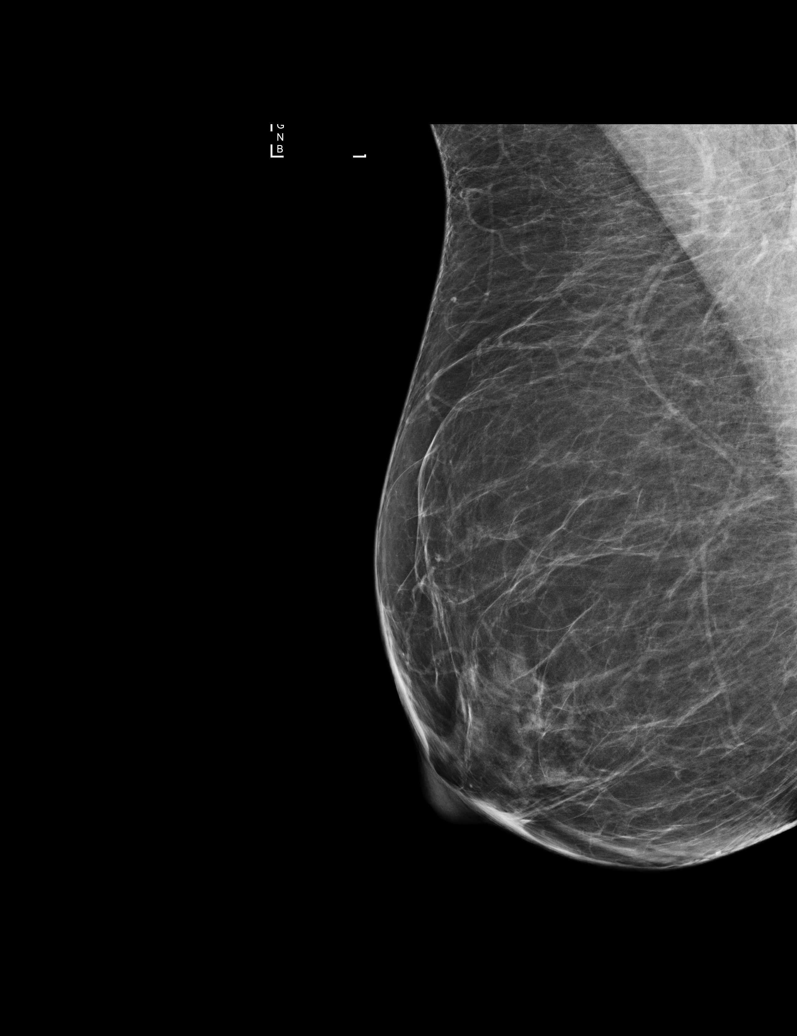

[L MLO (1 of 2)]
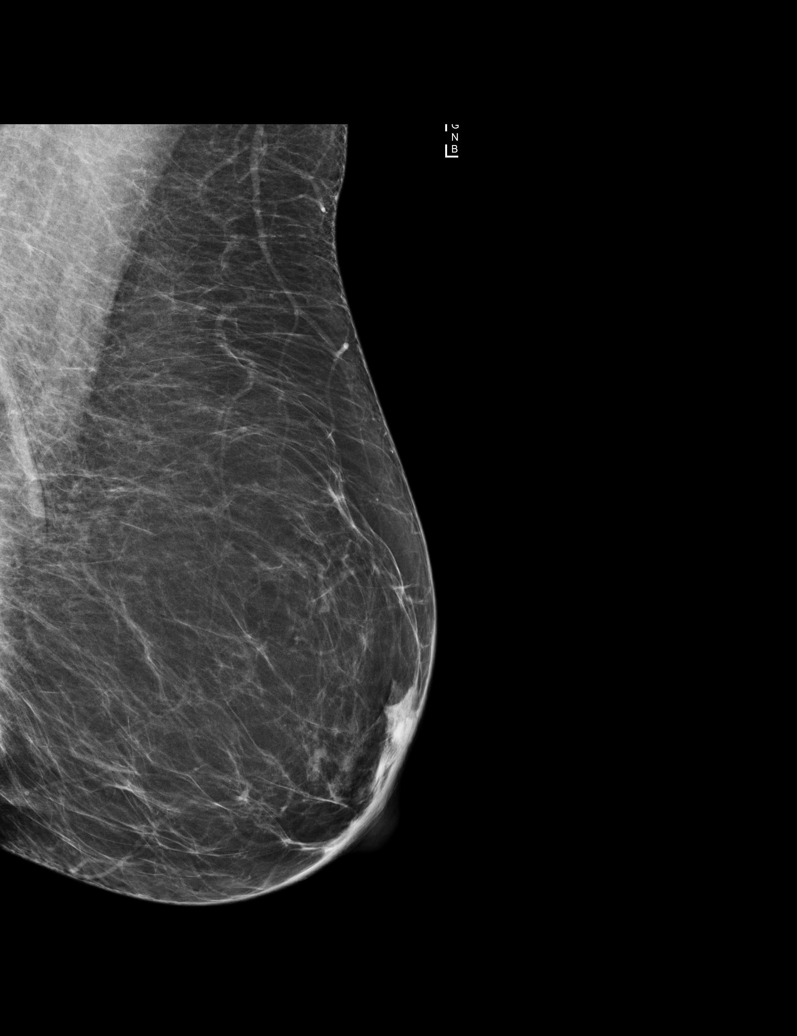

[L CC]
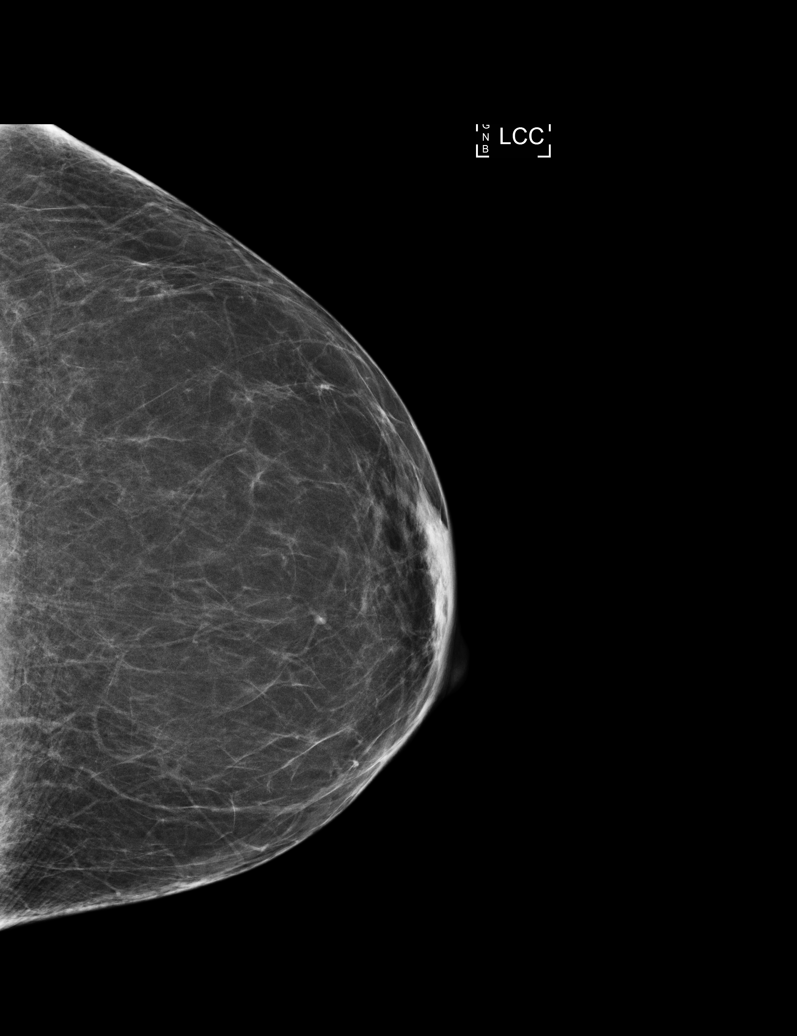

[L MLO (2 of 2)]
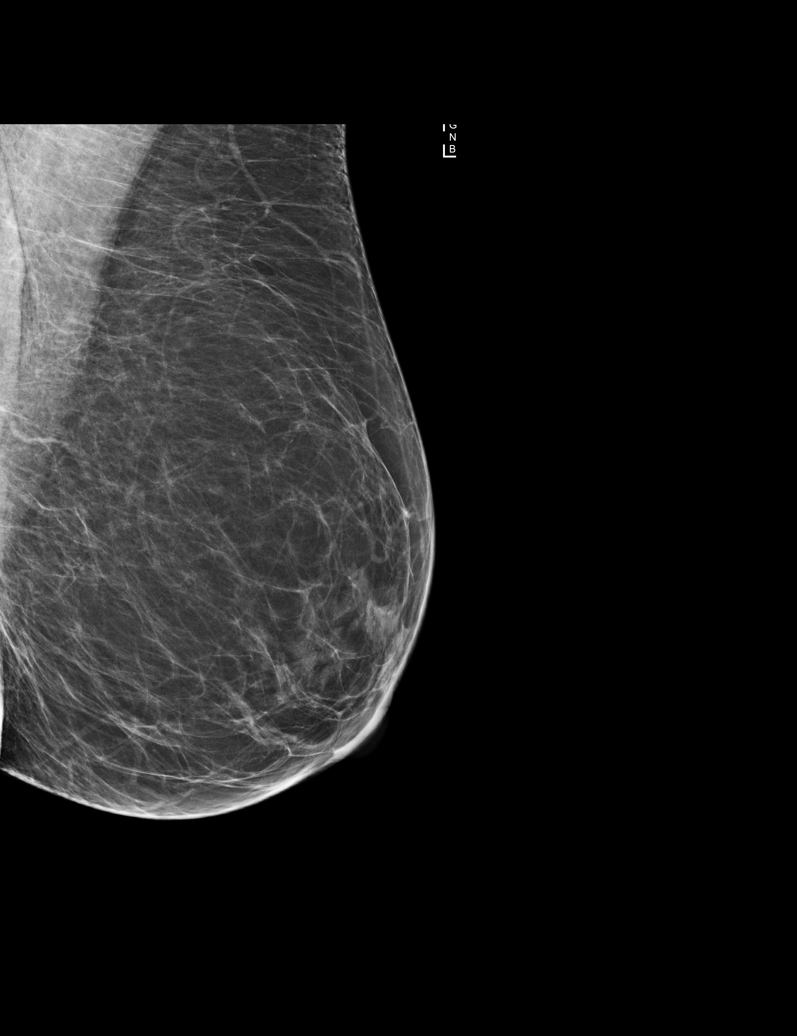

[5 of 5 positions shown; findings below may reference images not displayed]

ACR Breast Density Category b: There are scattered areas of
fibroglandular density.
FINDINGS: There are no findings suspicious for malignancy.
IMPRESSION: No mammographic evidence of malignancy. A result letter of this
screening mammogram will be mailed directly to the patient.

RECOMMENDATION:
Screening mammogram in one year. (Code:[id number]-V-ZRK)

BI-RADS CATEGORY  1: Negative.

## 2023-07-02 ENCOUNTER — Ambulatory Visit: Payer: Self-pay

## 2023-07-02 ENCOUNTER — Ambulatory Visit: Admitting: Family Medicine

## 2023-07-02 ENCOUNTER — Encounter: Payer: Self-pay | Admitting: Family Medicine

## 2023-07-02 VITALS — BP 110/76 | HR 86 | Temp 98.5°F | Wt 149.4 lb

## 2023-07-02 DIAGNOSIS — W57XXXA Bitten or stung by nonvenomous insect and other nonvenomous arthropods, initial encounter: Secondary | ICD-10-CM

## 2023-07-02 DIAGNOSIS — S30861A Insect bite (nonvenomous) of abdominal wall, initial encounter: Secondary | ICD-10-CM | POA: Diagnosis not present

## 2023-07-02 DIAGNOSIS — B009 Herpesviral infection, unspecified: Secondary | ICD-10-CM | POA: Diagnosis not present

## 2023-07-02 MED ORDER — VALACYCLOVIR HCL 1 G PO TABS: ORAL_TABLET | ORAL | 5 refills | Status: AC

## 2023-07-02 MED ORDER — DOXYCYCLINE HYCLATE 100 MG PO TABS: 100.0000 mg | ORAL_TABLET | Freq: Two times a day (BID) | ORAL | 0 refills | Status: DC

## 2023-07-02 MED ORDER — MOMETASONE FUROATE 50 MCG/ACT NA SUSP: 2.0000 | Freq: Every day | NASAL | 12 refills | Status: AC

## 2023-07-02 NOTE — Telephone Encounter (Signed)
 Chief Complaint: Tick Bite Symptoms: redness Frequency: Bitten yesterday Pertinent Negatives: Patient denies fever, N/V Disposition: [] ED /[] Urgent Care (no appt availability in office) / [x] Appointment(In office/virtual)/ []  [facility]/ [] Home Care/ [] Refused Recommended Disposition /[] [facility]/ []  Follow-up with PCP Additional Notes: Pt reports she was bitten by a tick yesterday, notes redness and ring around bite to left hip. Denies fever, HA, rash, N/V. OV scheduled. This RN educated pt on home care, new-worsening symptoms, when to call back/seek emergent care. Pt verbalized understanding and agrees to plan.    Copied from CRM [id number]. Topic: Clinical - Red Word Triage >> Jul 02, 2023  8:06 AM Zipporah Him wrote: Red Word that prompted transfer to Nurse Triage: Tick bite, left hip, very red, irritated and just happened yesterday Reason for Disposition  Red ring or bull's-eye rash occurs at tick bite  Answer Assessment - Initial Assessment Questions 1. ATTACHED:  "Is the tick still on the skin?"  (e.g., yes, no, unsure)     No  3. ONSET - TICK NOT STILL ATTACHED: "If the tick has been removed, how long do you think the tick was attached before you removed it?" (e.g., 5 hours, 2 days). "When was this?"     "Not very long" 4. LOCATION: "Where is the tick bite located?" (e.g., arm, leg)     Left Hip 5. TYPE of TICK: "Is it a wood tick or a deer tick?" (e.g., deer tick, wood tick; unsure)     Pt unsure 6. SIZE of TICK: "How big is the tick?" (e.g., size of poppy seed, apple seed, watermelon seed; unsure) Note: Deer ticks can be the size of a poppy seed (nymph) or an apple seed (adult).       Poppy seed 7. ENGORGED: "Did the tick look flat or engorged (full, swollen)?" (e.g., flat, engorged; unsure)     Flat 8. OTHER SYMPTOMS: "Do you have any other symptoms?" (e.g., fever, rash, redness at bite area, red ring around bite)     Redness, size of quarter  Protocols  used: Tick Bite-A-AH

## 2023-07-02 NOTE — Progress Notes (Signed)
   Subjective:    Patient ID: Elizabeth [name], female    DOB: 10/23/1965, 58 y.o.   MRN: [id number]  HPI Here for several tick bites. Yesterday she discovered a tick embedded in the skin of the left lower abdomen. She pulled this out with forceps. There were also several other bite marks in this area. Other than itching, she feels fine.   Review of Systems  Constitutional: Negative.   Respiratory: Negative.    Cardiovascular: Negative.        Objective:   Physical Exam Constitutional:      Appearance: Normal appearance.  Cardiovascular:     Rate and Rhythm: Normal rate and regular rhythm.     Pulses: Normal pulses.     Heart sounds: Normal heart sounds.  Pulmonary:     Effort: Pulmonary effort is normal.     Breath sounds: Normal breath sounds.  Skin:    Comments: There are 3 tiny bites marks in the LLQ, each surrounded by a small area of erythema  Neurological:     Mental Status: She is alert.           Assessment & Plan:  Tick bites, treat with 7 days of Doxycycline .  Corita Diego, MD

## 2023-07-03 ENCOUNTER — Telehealth: Payer: Self-pay | Admitting: *Deleted

## 2023-07-03 NOTE — Telephone Encounter (Signed)
 Left a message for the patient to return my call.

## 2023-07-03 NOTE — Telephone Encounter (Signed)
 Copied from CRM [id number]. Topic: Clinical - Medical Advice >> Jul 03, 2023 12:18 PM Earnestine Goes B wrote: Reason for CRM: pt called to speak with a nurse regarding her tick bite and the medication that was given pt states she now has black bumps on belly and her back. Pt is requesting a call back [phone]

## 2023-07-03 NOTE — Telephone Encounter (Signed)
 Pt had appointment with Dr [name] for this problem

## 2023-07-03 NOTE — Telephone Encounter (Signed)
 Spoke with the patient and she complains of a sudden onset of dark, red and brown spots on her back, abdomen and side today-denies itching or other symptoms.  Patient questioned if this was a reaction to the antibiotic?  Message sent to PCP.

## 2023-07-04 NOTE — Telephone Encounter (Signed)
 Yes this could be an allergic reaction. She should stop the Doxycycline  and take Benadryl as needed. Follow up as needed

## 2023-07-04 NOTE — Telephone Encounter (Signed)
 Left detailed message for pt regarding Dr [name] advise ?

## 2023-07-05 ENCOUNTER — Other Ambulatory Visit: Payer: Self-pay | Admitting: Obstetrics & Gynecology

## 2023-07-05 DIAGNOSIS — Z1231 Encounter for screening mammogram for malignant neoplasm of breast: Secondary | ICD-10-CM

## 2023-07-05 NOTE — Telephone Encounter (Signed)
Spoke with pt advised of Dr Fry recommendation, verbalized understanding 

## 2023-07-08 ENCOUNTER — Ambulatory Visit
Admission: RE | Admit: 2023-07-08 | Discharge: 2023-07-08 | Disposition: A | Discharge status: Home / Self Care | Source: Ambulatory Visit | Attending: Obstetrics & Gynecology | Admitting: Obstetrics & Gynecology

## 2023-07-08 DIAGNOSIS — Z1231 Encounter for screening mammogram for malignant neoplasm of breast: Secondary | ICD-10-CM

## 2023-08-14 ENCOUNTER — Encounter (HOSPITAL_BASED_OUTPATIENT_CLINIC_OR_DEPARTMENT_OTHER): Payer: Self-pay | Admitting: Obstetrics & Gynecology

## 2023-08-14 ENCOUNTER — Ambulatory Visit (INDEPENDENT_AMBULATORY_CARE_PROVIDER_SITE_OTHER): Admitting: Obstetrics & Gynecology

## 2023-08-14 ENCOUNTER — Other Ambulatory Visit (HOSPITAL_COMMUNITY)
Admission: RE | Admit: 2023-08-14 | Discharge: 2023-08-14 | Disposition: A | Discharge status: Home / Self Care | Source: Ambulatory Visit | Attending: Obstetrics & Gynecology | Admitting: Obstetrics & Gynecology

## 2023-08-14 VITALS — BP 118/75 | HR 81 | Wt 141.8 lb

## 2023-08-14 DIAGNOSIS — Z780 Asymptomatic menopausal state: Secondary | ICD-10-CM

## 2023-08-14 DIAGNOSIS — Z01419 Encounter for gynecological examination (general) (routine) without abnormal findings: Secondary | ICD-10-CM | POA: Diagnosis not present

## 2023-08-14 DIAGNOSIS — E2839 Other primary ovarian failure: Secondary | ICD-10-CM | POA: Diagnosis not present

## 2023-08-14 DIAGNOSIS — B009 Herpesviral infection, unspecified: Secondary | ICD-10-CM | POA: Diagnosis not present

## 2023-08-14 DIAGNOSIS — Z124 Encounter for screening for malignant neoplasm of cervix: Secondary | ICD-10-CM

## 2023-08-14 DIAGNOSIS — Z1211 Encounter for screening for malignant neoplasm of colon: Secondary | ICD-10-CM

## 2023-08-14 NOTE — Progress Notes (Signed)
   ANNUAL EXAM Patient name: Elizabeth [name] MRN [id number]  Date of birth: [birth year] Chief Complaint:   Gynecologic Exam  History of Present Illness:   Elizabeth [name] is a 58 y.o. G0P0000 Caucasian female being seen today for a routine annual exam.  Denies vaginal bleeding.  Doing well.     Patient's last menstrual period was 07/15/2011.  Last pap 11/21/2020. Results were: NILM w/ HRHPV negative. H/O abnormal pap: no Last mammogram: 07/08/2023. Results were: normal. Family h/o breast cancer: no Last colonoscopy: 01/15/2014. Results were: normal. Family h/o colorectal cancer: no DEXA:  discussed screening     05/10/2023    1:12 PM 11/21/2020    9:00 AM  Depression screen PHQ 2/9  Decreased Interest 0 0  Down, Depressed, Hopeless 0 0  PHQ - 2 Score 0 0     Review of Systems:   Pertinent items are noted in HPI Denies any headaches, blurred vision, fatigue, shortness of breath, chest pain, abdominal pain, abnormal vaginal discharge/itching/odor/irritation, problems with periods, bowel movements, urination, or intercourse unless otherwise stated above. Pertinent History Reviewed:  Reviewed past medical,surgical, social and family history.  Reviewed problem list, medications and allergies. Physical Assessment:   Vitals:   08/14/23 0943  BP: 118/75  Pulse: 81  SpO2: 99%  Weight: 141 lb 12.8 oz (64.3 kg)  Body mass index is 24.34 kg/m.        Physical Examination:   General appearance - well appearing, and in no distress  Mental status - alert, oriented to person, place, and time  Psych:  She has a normal mood and affect  Skin - warm and dry, normal color, no suspicious lesions noted  Chest - effort normal, all lung fields clear to auscultation bilaterally  Heart - normal rate and regular rhythm  Neck:  midline trachea, no thyromegaly or nodules  Breasts - breasts appear normal, no suspicious masses, no skin or nipple changes or  axillary nodes  Abdomen - soft, nontender,  nondistended, no masses or organomegaly  Pelvic - VULVA: normal appearing vulva with no masses, tenderness or lesions   VAGINA: normal appearing vagina with normal color and discharge, no lesions   CERVIX: normal appearing cervix without discharge or lesions, no CMT  Thin prep pap is done with HR HPV cotesting  UTERUS: uterus is felt to be normal size, shape, consistency and nontender   ADNEXA: No adnexal masses or tenderness noted.  Rectal - normal rectal, good sphincter tone, no masses felt.   Extremities:  No swelling or varicosities noted  Chaperone present for exam  Assessment & Plan:  1. Well woman exam with routine gynecological exam (Primary) - Pap smear updated today - Mammogram 07/2023 - Colonoscopy 2015.  Declines doing another one.   - Bone mineral density ordered - lab work done with PCP, Dr. Johnny - vaccines reviewed/updated  2. Colon cancer screening - Cologuard  3. Cervical cancer screening - Cytology - PAP( [place])  4. HSV (herpes simplex virus) infection - does not need valtrex  RF  5. Postmenopausal   Orders Placed This Encounter  Procedures   DG Bone Density   Cologuard    Meds: No orders of the defined types were placed in this encounter.   Follow-up: Return for pt will return 3 years for pap smear.  Reasons for earlier appointments given. SABRA Ronal GORMAN Cleotilde, MD 08/17/2023 11:35 AM

## 2023-08-17 ENCOUNTER — Encounter (HOSPITAL_BASED_OUTPATIENT_CLINIC_OR_DEPARTMENT_OTHER): Payer: Self-pay | Admitting: Obstetrics & Gynecology

## 2023-08-21 ENCOUNTER — Ambulatory Visit (HOSPITAL_BASED_OUTPATIENT_CLINIC_OR_DEPARTMENT_OTHER): Payer: Self-pay | Admitting: Obstetrics & Gynecology

## 2023-08-21 LAB — CYTOLOGY - PAP
Comment: NEGATIVE
Diagnosis: NEGATIVE
High risk HPV: NEGATIVE

## 2023-09-02 LAB — COLOGUARD: COLOGUARD: NEGATIVE

## 2023-12-03 ENCOUNTER — Other Ambulatory Visit: Payer: Self-pay | Admitting: Family Medicine
